# Patient Record
Sex: Male | Born: 1965 | Race: White | Hispanic: No | Marital: Married | Smoking: Never smoker
Health system: Southern US, Community
[De-identification: ages and names within clinical notes are randomized; demographics above are authoritative.]

## PROBLEM LIST (undated history)

## (undated) DIAGNOSIS — E669 Obesity, unspecified: Secondary | ICD-10-CM

## (undated) DIAGNOSIS — L409 Psoriasis, unspecified: Secondary | ICD-10-CM

## (undated) DIAGNOSIS — E785 Hyperlipidemia, unspecified: Secondary | ICD-10-CM

## (undated) DIAGNOSIS — G4726 Circadian rhythm sleep disorder, shift work type: Secondary | ICD-10-CM

## (undated) DIAGNOSIS — R5383 Other fatigue: Secondary | ICD-10-CM

## (undated) DIAGNOSIS — N200 Calculus of kidney: Secondary | ICD-10-CM

## (undated) DIAGNOSIS — E66811 Obesity, class 1: Secondary | ICD-10-CM

## (undated) DIAGNOSIS — I10 Essential (primary) hypertension: Secondary | ICD-10-CM

## (undated) DIAGNOSIS — Z8042 Family history of malignant neoplasm of prostate: Secondary | ICD-10-CM

## (undated) HISTORY — DX: Essential (primary) hypertension: I10

## (undated) HISTORY — DX: Obesity, unspecified: E66.9

## (undated) HISTORY — DX: Hyperlipidemia, unspecified: E78.5

## (undated) HISTORY — DX: Other fatigue: R53.83

## (undated) HISTORY — DX: Obesity, class 1: E66.811

## (undated) HISTORY — DX: Circadian rhythm sleep disorder, shift work type: G47.26

## (undated) HISTORY — DX: Calculus of kidney: N20.0

## (undated) HISTORY — PX: KIDNEY STONE SURGERY: SHX686

## (undated) HISTORY — DX: Family history of malignant neoplasm of prostate: Z80.42

## (undated) HISTORY — PX: KNEE ARTHROSCOPY W/ ACL RECONSTRUCTION AND PATELLA GRAFT: SHX1861

## (undated) HISTORY — DX: Psoriasis, unspecified: L40.9

---

## 2008-03-27 ENCOUNTER — Ambulatory Visit: Payer: Self-pay | Admitting: Occupational Medicine

## 2008-09-24 ENCOUNTER — Ambulatory Visit: Payer: Self-pay | Admitting: Family Medicine

## 2011-05-08 ENCOUNTER — Encounter: Payer: Self-pay | Admitting: Family Medicine

## 2011-05-08 ENCOUNTER — Ambulatory Visit (INDEPENDENT_AMBULATORY_CARE_PROVIDER_SITE_OTHER): Payer: BC Managed Care – PPO | Admitting: Family Medicine

## 2011-05-08 DIAGNOSIS — Z136 Encounter for screening for cardiovascular disorders: Secondary | ICD-10-CM

## 2011-05-08 DIAGNOSIS — E669 Obesity, unspecified: Secondary | ICD-10-CM

## 2011-05-08 DIAGNOSIS — Z1211 Encounter for screening for malignant neoplasm of colon: Secondary | ICD-10-CM

## 2011-05-08 DIAGNOSIS — R5383 Other fatigue: Secondary | ICD-10-CM | POA: Insufficient documentation

## 2011-05-08 DIAGNOSIS — N200 Calculus of kidney: Secondary | ICD-10-CM

## 2011-05-08 DIAGNOSIS — Z23 Encounter for immunization: Secondary | ICD-10-CM

## 2011-05-08 DIAGNOSIS — Z0000 Encounter for general adult medical examination without abnormal findings: Secondary | ICD-10-CM

## 2011-05-08 DIAGNOSIS — Z283 Underimmunization status: Secondary | ICD-10-CM

## 2011-05-08 HISTORY — DX: Calculus of kidney: N20.0

## 2011-05-08 LAB — CBC WITH DIFFERENTIAL/PLATELET
Eosinophils Absolute: 0.2 10*3/uL (ref 0.0–0.7)
Hemoglobin: 15.4 g/dL (ref 13.0–17.0)
Lymphocytes Relative: 17 % (ref 12–46)
Lymphs Abs: 1.8 10*3/uL (ref 0.7–4.0)
MCH: 30.8 pg (ref 26.0–34.0)
Monocytes Relative: 9 % (ref 3–12)
Neutro Abs: 7.6 10*3/uL (ref 1.7–7.7)
Neutrophils Relative %: 73 % (ref 43–77)
RBC: 5 MIL/uL (ref 4.22–5.81)
WBC: 10.4 10*3/uL (ref 4.0–10.5)

## 2011-05-08 LAB — COMPREHENSIVE METABOLIC PANEL
ALT: 54 U/L — ABNORMAL HIGH (ref 0–53)
Albumin: 4.5 g/dL (ref 3.5–5.2)
CO2: 28 mEq/L (ref 19–32)
Chloride: 102 mEq/L (ref 96–112)
Glucose, Bld: 89 mg/dL (ref 70–99)
Potassium: 4 mEq/L (ref 3.5–5.3)
Sodium: 139 mEq/L (ref 135–145)
Total Protein: 7.2 g/dL (ref 6.0–8.3)

## 2011-05-08 LAB — POCT URINALYSIS DIPSTICK
Bilirubin, UA: NEGATIVE
Glucose, UA: NEGATIVE
Leukocytes, UA: NEGATIVE
Nitrite, UA: NEGATIVE
Urobilinogen, UA: 0.2

## 2011-05-08 LAB — HEMOGLOBIN A1C: Hgb A1c MFr Bld: 5.9 % — ABNORMAL HIGH (ref <5.7)

## 2011-05-08 LAB — PSA: PSA: 1.12 ng/mL (ref <=4.00)

## 2011-05-08 LAB — LIPID PANEL
Cholesterol: 185 mg/dL (ref 0–200)
Triglycerides: 239 mg/dL — ABNORMAL HIGH (ref <150)

## 2011-05-08 LAB — VITAMIN B12: Vitamin B-12: 462 pg/mL (ref 211–911)

## 2011-05-08 MED ORDER — OMEPRAZOLE 40 MG PO CPDR: 40.0000 mg | DELAYED_RELEASE_CAPSULE | Freq: Every day | ORAL | Status: DC

## 2011-05-08 NOTE — Patient Instructions (Signed)
Exercise Exercise to Lose Weight Exercise and a healthy diet may help you lose weight. Your doctor may suggest specific exercises. EXERCISE IDEAS AND TIPS  Choose low-cost things you enjoy doing, such as walking, bicycling, or exercising to workout videos.   Take stairs instead of the elevator.   Walk during your lunch break.   Park your car further away from work or school.   Go to a gym or an exercise class.   Start with 5 to 10 minutes of exercise each day. Build up to 30 minutes of exercise 4 to 6 days a week.   Wear shoes with good support and comfortable clothes.   Stretch before and after working out.   Work out until you breathe harder and your heart beats faster.   Drink extra water when you exercise.   Do not do so much that you hurt yourself, feel dizzy, or get very short of breath.  Exercises that burn about 150 calories:  Running 1  miles in 15 minutes.   Playing volleyball for 45 to 60 minutes.   Washing and waxing a car for 45 to 60 minutes.   Playing touch football for 45 minutes.   Walking 1  miles in 35 minutes.   Pushing a stroller 1  miles in 30 minutes.   Playing basketball for 30 minutes.   Raking leaves for 30 minutes.   Bicycling 5 miles in 30 minutes.   Walking 2 miles in 30 minutes.   Dancing for 30 minutes.   Shoveling snow for 15 minutes.   Swimming laps for 20 minutes.   Walking up stairs for 15 minutes.   Bicycling 4 miles in 15 minutes.   Gardening for 30 to 45 minutes.   Jumping rope for 15 minutes.   Washing windows or floors for 45 to 60 minutes.  Document Released: 03/22/2010 Document Revised: 02/06/2011 Document Reviewed: 03/22/2010 [facility] Patient Information 2012 [place], [place].

## 2011-05-08 NOTE — Progress Notes (Addendum)
Subjective:    Patient ID: Jerry Miller, male    DOB: Feb 01, 1966, 46 y.o.   MRN: [id number]  HPI Patient's here for physical examination and establishment in this practice. Reports being overweight wife somewhat concerned about over fatigue. His father had prostate cancer in both his father and mother developed diabetes in his father marked diabetes before he passed away. He recognized the fact that he does need to exercise and we talked about him being at the almost 35% BMI. The only medication he is taking his Prilosec 40 mg currently he was evaluated at the GI specialty clinic and was recommended that he takes this on a regular basis. He should be noted that he has had kidney stones before.    Review of Systems  Gastrointestinal:       Heartburn and reflux  All other systems reviewed and are negative.    No Known Allergies History   Social History  . Marital Status: Married    Spouse Name: N/A    Number of Children: N/A  . Years of Education: N/A   Occupational History  . Not on file.   Social History Main Topics  . Smoking status: Never Smoker   . Smokeless tobacco: Never Used  . Alcohol Use: Not on file  . Drug Use: Not on file  . Sexually Active: Yes    Birth Control/ Protection: Implant   Other Topics Concern  . Not on file   Social History Narrative  . No narrative on file   Family History  Problem Relation Age of Onset  . Diabetes Mother   . Diabetes Father   . Heart failure Father   . Nephrolithiasis Sister    Past Medical History  Diagnosis Date  . Fatigue   . Obesity (BMI 30.0-34.9)    Past Surgical History  Procedure Date  . Knee arthroscopy w/ acl reconstruction and patella graft   . Kidney stone surgery        BP 129/82  Pulse 68  Ht 5\' 8"  (1.727 m)  Wt 230 lb (104.327 kg)  BMI 34.97 kg/m2  SpO2 98% Objective:   Physical Exam  Constitutional: He is oriented to person, place, and time. He appears well-developed and  well-nourished.       obese WM  HENT:  Head: Normocephalic and atraumatic.  Right Ear: External ear normal.  Left Ear: External ear normal.  Mouth/Throat: Oropharynx is clear and moist.  Eyes: Pupils are equal, round, and reactive to light. Right eye exhibits no discharge. Left eye exhibits no discharge.  Neck: Normal range of motion. Neck supple. No thyromegaly present.  Cardiovascular: Normal rate, regular rhythm and normal heart sounds.   Pulmonary/Chest: Effort normal and breath sounds normal. No respiratory distress. He has no wheezes.  Abdominal: Soft. Bowel sounds are normal. He exhibits no distension. There is no tenderness. There is no rebound. Hernia confirmed negative in the right inguinal area and confirmed negative in the left inguinal area.  Genitourinary: Rectum normal, prostate normal, testes normal and penis normal. Rectal exam shows no fissure, no mass, no tenderness and anal tone normal. Guaiac negative stool. Prostate is not tender. Right testis shows no mass, no swelling and no tenderness. Left testis shows no mass, no swelling and no tenderness. Circumcised. No phimosis, penile erythema or penile tenderness. No discharge found.  Musculoskeletal: Normal range of motion. He exhibits no edema and no tenderness.  Neurological: He is alert and oriented to person, place, and time. He has  normal reflexes.  Skin: Skin is warm.  Psychiatric: He has a normal mood and affect. His behavior is normal.    Results for orders placed in visit on 05/08/11  POCT URINALYSIS DIPSTICK      Component Value Range   Color, UA yellow     Clarity, UA clear     Glucose, UA neg     Bilirubin, UA neg     Ketones, UA neg     Spec Grav, UA >=1.030     Blood, UA trace-intact     pH, UA 6.0     Protein, UA neg     Urobilinogen, UA 0.2     Nitrite, UA neg     Leukocytes, UA Negative     EKG normal sinus rhythm.     Assessment & Plan:  #1 health maintenance. Stressed need to exercise to lose  weight obtain PSA TSH B12 lipid CMP CBC #2 obesity stressed to him to start exercising discuss some other weight loss programs he may want to look at #3 he has had a history of hyperlipidemia before in the past was never placed on medication so we will have him come back in 6 months followup #4 immunization update will be done as well.

## 2011-05-09 ENCOUNTER — Encounter: Payer: Self-pay | Admitting: Family Medicine

## 2011-05-13 ENCOUNTER — Encounter: Payer: Self-pay | Admitting: *Deleted

## 2011-11-04 ENCOUNTER — Ambulatory Visit (INDEPENDENT_AMBULATORY_CARE_PROVIDER_SITE_OTHER): Payer: BC Managed Care – PPO | Admitting: Family Medicine

## 2011-11-04 ENCOUNTER — Encounter: Payer: Self-pay | Admitting: Family Medicine

## 2011-11-04 VITALS — BP 129/88 | HR 68 | Ht 68.0 in | Wt 227.0 lb

## 2011-11-04 DIAGNOSIS — R030 Elevated blood-pressure reading, without diagnosis of hypertension: Secondary | ICD-10-CM

## 2011-11-04 DIAGNOSIS — R7301 Impaired fasting glucose: Secondary | ICD-10-CM

## 2011-11-04 DIAGNOSIS — E669 Obesity, unspecified: Secondary | ICD-10-CM

## 2011-11-04 DIAGNOSIS — J3489 Other specified disorders of nose and nasal sinuses: Secondary | ICD-10-CM

## 2011-11-04 DIAGNOSIS — M5380 Other specified dorsopathies, site unspecified: Secondary | ICD-10-CM

## 2011-11-04 DIAGNOSIS — E785 Hyperlipidemia, unspecified: Secondary | ICD-10-CM

## 2011-11-04 DIAGNOSIS — M62830 Muscle spasm of back: Secondary | ICD-10-CM

## 2011-11-04 LAB — LIPID PANEL
HDL: 41 mg/dL (ref >39)
Total CHOL/HDL Ratio: 4.2 Ratio
Triglycerides: 178 mg/dL — ABNORMAL HIGH (ref <150)

## 2011-11-04 MED ORDER — FEXOFENADINE-PSEUDOEPHED ER 180-240 MG PO TB24: 1.0000 | ORAL_TABLET | Freq: Every day | ORAL | Status: DC

## 2011-11-04 MED ORDER — ORPHENADRINE CITRATE ER 100 MG PO TB12: 100.0000 mg | ORAL_TABLET | Freq: Every evening | ORAL | Status: DC | PRN

## 2011-11-04 MED ORDER — MOMETASONE FUROATE 50 MCG/ACT NA SUSP: 2.0000 | Freq: Every day | NASAL | Status: DC

## 2011-11-04 MED ORDER — MELOXICAM 15 MG PO TABS: 15.0000 mg | ORAL_TABLET | Freq: Every day | ORAL | Status: DC | PRN

## 2011-11-04 NOTE — Progress Notes (Signed)
Subjective:    Patient ID: Jerry Miller, male    DOB: 04/03/1965, 46 y.o.   MRN: [id number]  HPI #1 obesity/hyperlipidemia patient has been taking some niacin tablets not every day but some to see if that would help with his cholesterol triglyceride of. His triglycerides were elevated at 239. He admits that he has not been exercising a regular basis and that is something he needs to do #2 elevated blood sugar/early metabolic syndrome A1c was elevated at 5.9 at his physical we will need to recheck that. Also explained to him the importance of A1c and that prolonged A1c in my own clinical experiences only gets worse and not better unless something active is done by the patient. #3 borderline hypertension/elevated blood pressure this could be part of the metabolic syndrome. #4 sinus drainage problems. He finds that when he has a URI his sinuses become quickly plugged and he will start developing sinus headache and pain until the blockage improves.Marland Kitchen He wants to stay away from antibiotics which is good wants note was that would help improve his sinus drainage when he experiences a URI. 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 He states that there are palpable lungs in his back when this happens but those lumps are not present now.   #6 he has also noticed some lumps on his right forearm he wants me to examine as well.    Review of Systems  Constitutional: Positive for appetite change. Negative for activity change.  Neurological: Negative for dizziness.  All other systems reviewed and are negative.     BP 129/88  Pulse 68  Ht 5\' 8"  (1.727 m)  Wt 227 lb (102.967 kg)  BMI 34.52 kg/m2  SpO2 98% Objective:   Physical Exam  Vitals reviewed. Constitutional: He is oriented to person, place, and time. He appears well-developed and well-nourished.  HENT:  Head: Normocephalic.  Neck: Normal range of motion. Neck supple.  Cardiovascular:  Normal rate and regular rhythm.   Pulmonary/Chest: Effort normal and breath sounds normal.  Musculoskeletal: Normal range of motion. He exhibits no edema and no tenderness.       No muscle spasms of the back were found at this time. Patient had no tenderness and good range of motion.  Along the right forearm small lipoma or sebaceous cyst could be palpated not tender to palpation or to touch.    Neurological: He is alert and oriented to person, place, and time.  Skin: Skin is warm and dry. No erythema.  Psychiatric: He has a normal mood and affect. His behavior is normal.      Results for orders placed in visit on 05/08/11  HEMOGLOBIN A1C      Component Value Range   Hemoglobin A1C 5.9 (*) <5.7 %   Mean Plasma Glucose 123 (*) <117 mg/dL  CBC WITH DIFFERENTIAL      Component Value Range   WBC 10.4  4.0 - 10.5 K/uL   RBC 5.00  4.22 - 5.81 MIL/uL   Hemoglobin 15.4  13.0 - 17.0 g/dL   HCT 69.6  29.5 - 28.4 %   MCV 91.0  78.0 - 100.0 fL   MCH 30.8  26.0 - 34.0 pg   MCHC 33.8  30.0 - 36.0 g/dL   RDW 13.2  44.0 - 10.2 %   Platelets 250  150 - 400 K/uL   Neutrophils Relative 73  43 - 77 %  Neutro Abs 7.6  1.7 - 7.7 K/uL   Lymphocytes Relative 17  12 - 46 %   Lymphs Abs 1.8  0.7 - 4.0 K/uL   Monocytes Relative 9  3 - 12 %   Monocytes Absolute 0.9  0.1 - 1.0 K/uL   Eosinophils Relative 1  0 - 5 %   Eosinophils Absolute 0.2  0.0 - 0.7 K/uL   Basophils Relative 0  0 - 1 %   Basophils Absolute 0.0  0.0 - 0.1 K/uL   Smear Review Criteria for review not met    COMPREHENSIVE METABOLIC PANEL      Component Value Range   Sodium 139  135 - 145 mEq/L   Potassium 4.0  3.5 - 5.3 mEq/L   Chloride 102  96 - 112 mEq/L   CO2 28  19 - 32 mEq/L   Glucose, Bld 89  70 - 99 mg/dL   BUN 13  6 - 23 mg/dL   Creat 1.19  1.47 - 8.29 mg/dL   Total Bilirubin 0.7  0.3 - 1.2 mg/dL   Alkaline Phosphatase 84  39 - 117 U/L   AST 24  0 - 37 U/L   ALT 54 (*) 0 - 53 U/L   Total Protein 7.2  6.0 - 8.3 g/dL    Albumin 4.5  3.5 - 5.2 g/dL   Calcium 9.6  8.4 - 56.2 mg/dL  LIPID PANEL      Component Value Range   Cholesterol 185  0 - 200 mg/dL   Triglycerides 130 (*) <150 mg/dL   HDL 40  >86 mg/dL   Total CHOL/HDL Ratio 4.6     VLDL 48 (*) 0 - 40 mg/dL   LDL Cholesterol 97  0 - 99 mg/dL  TSH      Component Value Range   TSH 2.391  0.350 - 4.500 uIU/mL  VITAMIN B12      Component Value Range   Vitamin B-12 462  211 - 911 pg/mL  PSA      Component Value Range   PSA 1.12  <=4.00 ng/mL  POCT URINALYSIS DIPSTICK      Component Value Range   Color, UA yellow     Clarity, UA clear     Glucose, UA neg     Bilirubin, UA neg     Ketones, UA neg     Spec Grav, UA >=1.030     Blood, UA trace-intact     pH, UA 6.0     Protein, UA neg     Urobilinogen, UA 0.2     Nitrite, UA neg     Leukocytes, UA Negative          Lab Results  Component Value Date   HGBA1C 5.9* 05/08/2011   Results for orders placed in visit on 11/04/11  POCT GLYCOSYLATED HEMOGLOBIN (HGB A1C)      Component Value Range   Hemoglobin A1C 5.9      Assessment & Plan:  #1 obesity/hyperlipidemia stressed the importance of exercise discussed the third middle replacements we can try even supplements or medications to help reduce his appetite but the most important thing is  getting involved in an exercise program. We will see him back in 7 months for his yearly exam. We will check a lipid panel today.  #2 syndrome x.  At this time with his blood pressure being borderline and his A1c staying the same no new medications were started and will followup in 7 months.  #  3 sinus pressure.will prescribe Flonase and Allegra-D for patient to use when necessary basis when he he feels his sinus ducts are obstructed.   #4 back pain/muscle spasm. Explained that this appears to be muscle spasms and that along with ice on the back we will try him on some Mobic 15 mg a day and Norflex 100 mg at night when he does have muscle spasms.   #5  lipomas/sebaceous cyst of the right forearm. Explained to him since his night growing would not do anything different her to come back if they start growing or becomes painful.

## 2011-11-04 NOTE — Patient Instructions (Signed)
Hypertriglyceridemia  Diet for High blood levels of Triglycerides Most fats in food are triglycerides. Triglycerides in your blood are stored as fat in your body. High levels of triglycerides in your blood may put you at a greater risk for heart disease and stroke.  Normal triglyceride levels are less than 150 mg/dL. Borderline high levels are 150-199 mg/dl. High levels are 200 - 499 mg/dL, and very high triglyceride levels are greater than 500 mg/dL. The decision to treat high triglycerides is generally based on the level. For people with borderline or high triglyceride levels, treatment includes weight loss and exercise. Drugs are recommended for people with very high triglyceride levels. Many people who need treatment for high triglyceride levels have metabolic syndrome. This syndrome is a collection of disorders that often include: insulin resistance, high blood pressure, blood clotting problems, high cholesterol and triglycerides. TESTING PROCEDURE FOR TRIGLYCERIDES  You should not eat 4 hours before getting your triglycerides measured. The normal range of triglycerides is between 10 and 250 milligrams per deciliter (mg/dl). Some people may have extreme levels (1000 or above), but your triglyceride level may be too high if it is above 150 mg/dl, depending on what other risk factors you have for heart disease.   People with high blood triglycerides may also have high blood cholesterol levels. If you have high blood cholesterol as well as high blood triglycerides, your risk for heart disease is probably greater than if you only had high triglycerides. High blood cholesterol is one of the main risk factors for heart disease.  CHANGING YOUR DIET  Your weight can affect your blood triglyceride level. If you are more than 20% above your ideal body weight, you may be able to lower your blood triglycerides by losing weight. Eating less and exercising regularly is the best way to combat this. Fat provides  more calories than any other food. The best way to lose weight is to eat less fat. Only 30% of your total calories should come from fat. Less than 7% of your diet should come from saturated fat. A diet low in fat and saturated fat is the same as a diet to decrease blood cholesterol. By eating a diet lower in fat, you may lose weight, lower your blood cholesterol, and lower your blood triglyceride level.  Eating a diet low in fat, especially saturated fat, may also help you lower your blood triglyceride level. Ask your dietitian to help you figure how much fat you can eat based on the number of calories your caregiver has prescribed for you.  Exercise, in addition to helping with weight loss may also help lower triglyceride levels.   Alcohol can increase blood triglycerides. You may need to stop drinking alcoholic beverages.   Too much carbohydrate in your diet may also increase your blood triglycerides. Some complex carbohydrates are necessary in your diet. These may include bread, rice, potatoes, other starchy vegetables and cereals.   Reduce "simple" carbohydrates. These may include pure sugars, candy, honey, and jelly without losing other nutrients. If you have the kind of high blood triglycerides that is affected by the amount of carbohydrates in your diet, you will need to eat less sugar and less high-sugar foods. Your caregiver can help you with this.   Adding 2-4 grams of fish oil (EPA+ DHA) may also help lower triglycerides. Speak with your caregiver before adding any supplements to your regimen.  Following the Diet  Maintain your ideal weight. Your caregivers can help you with a diet. Generally,   eating less food and getting more exercise will help you lose weight. Joining a weight control group may also help. Ask your caregivers for a good weight control group in your area.  Eat low-fat foods instead of high-fat foods. This can help you lose weight too.  These foods are lower in fat. Eat MORE  of these:   Dried beans, peas, and lentils.   Egg whites.   Low-fat cottage cheese.   Fish.   Lean cuts of meat, such as round, sirloin, rump, and flank (cut extra fat off meat you fix).   Whole grain breads, cereals and pasta.   Skim and nonfat dry milk.   Low-fat yogurt.   Poultry without the skin.   Cheese made with skim or part-skim milk, such as mozzarella, parmesan, farmers', ricotta, or pot cheese.  These are higher fat foods. Eat LESS of these:   Whole milk and foods made from whole milk, such as American, blue, cheddar, monterey jack, and swiss cheese   High-fat meats, such as luncheon meats, sausages, knockwurst, bratwurst, hot dogs, ribs, corned beef, ground pork, and regular ground beef.   Fried foods.  Limit saturated fats in your diet. Substituting unsaturated fat for saturated fat may decrease your blood triglyceride level. You will need to read package labels to know which products contain saturated fats.  These foods are high in saturated fat. Eat LESS of these:   Fried pork skins.   Whole milk.   Skin and fat from poultry.   Palm oil.   Butter.   Shortening.   Cream cheese.   Bacon.   Margarines and baked goods made from listed oils.   Vegetable shortenings.   Chitterlings.   Fat from meats.   Coconut oil.   Palm kernel oil.   Lard.   Cream.   Sour cream.   Fatback.   Coffee whiteners and non-dairy creamers made with these oils.   Cheese made from whole milk.  Use unsaturated fats (both polyunsaturated and monounsaturated) moderately. Remember, even though unsaturated fats are better than saturated fats; you still want a diet low in total fat.  These foods are high in unsaturated fat:   Canola oil.   Sunflower oil.   Mayonnaise.   Almonds.   Peanuts.   Pine nuts.   Margarines made with these oils.   Safflower oil.   Olive oil.   Avocados.   Cashews.   Peanut butter.   Sunflower seeds.   Soybean oil.     Peanut oil.   Olives.   Pecans.   Walnuts.   Pumpkin seeds.  Avoid sugar and other high-sugar foods. This will decrease carbohydrates without decreasing other nutrients. Sugar in your food goes rapidly to your blood. When there is excess sugar in your blood, your liver may use it to make more triglycerides. Sugar also contains calories without other important nutrients.  Eat LESS of these:   Sugar, brown sugar, powdered sugar, jam, jelly, preserves, honey, syrup, molasses, pies, candy, cakes, cookies, frosting, pastries, colas, soft drinks, punches, fruit drinks, and regular gelatin.   Avoid alcohol. Alcohol, even more than sugar, may increase blood triglycerides. In addition, alcohol is high in calories and low in nutrients. Ask for sparkling water, or a diet soft drink instead of an alcoholic beverage.  Suggestions for planning and preparing meals   Bake, broil, grill or roast meats instead of frying.   Remove fat from meats and skin from poultry before cooking.   Add spices,   herbs, lemon juice or vinegar to vegetables instead of salt, rich sauces or gravies.   Use a non-stick skillet without fat or use no-stick sprays.   Cool and refrigerate stews and broth. Then remove the hardened fat floating on the surface before serving.   Refrigerate meat drippings and skim off fat to make low-fat gravies.   Serve more fish.   Use less butter, margarine and other high-fat spreads on bread or vegetables.   Use skim or reconstituted non-fat dry milk for cooking.   Cook with low-fat cheeses.   Substitute low-fat yogurt or cottage cheese for all or part of the sour cream in recipes for sauces, dips or congealed salads.   Use half yogurt/half mayonnaise in salad recipes.   Substitute evaporated skim milk for cream. Evaporated skim milk or reconstituted non-fat dry milk can be whipped and substituted for whipped cream in certain recipes.   Choose fresh fruits for dessert instead of  high-fat foods such as pies or cakes. Fruits are naturally low in fat.  When Dining Out   Order low-fat appetizers such as fruit or vegetable juice, pasta with vegetables or tomato sauce.   Select clear, rather than cream soups.   Ask that dressings and gravies be served on the side. Then use less of them.   Order foods that are baked, broiled, poached, steamed, stir-fried, or roasted.   Ask for margarine instead of butter, and use only a small amount.   Drink sparkling water, unsweetened tea or coffee, or diet soft drinks instead of alcohol or other sweet beverages.  QUESTIONS AND ANSWERS ABOUT OTHER FATS IN THE BLOOD: SATURATED FAT, TRANS FAT, AND CHOLESTEROL What is trans fat? Trans fat is a type of fat that is formed when vegetable oil is hardened through a process called hydrogenation. This process helps makes foods more solid, gives them shape, and prolongs their shelf life. Trans fats are also called hydrogenated or partially hydrogenated oils.  What do saturated fat, trans fat, and cholesterol in foods have to do with heart disease? Saturated fat, trans fat, and cholesterol in the diet all raise the level of LDL "bad" cholesterol in the blood. The higher the LDL cholesterol, the greater the risk for coronary heart disease (CHD). Saturated fat and trans fat raise LDL similarly.  What foods contain saturated fat, trans fat, and cholesterol? High amounts of saturated fat are found in animal products, such as fatty cuts of meat, chicken skin, and full-fat dairy products like butter, whole milk, cream, and cheese, and in tropical vegetable oils such as palm, palm kernel, and coconut oil. Trans fat is found in some of the same foods as saturated fat, such as vegetable shortening, some margarines (especially hard or stick margarine), crackers, cookies, baked goods, fried foods, salad dressings, and other processed foods made with partially hydrogenated vegetable oils. Small amounts of trans fat  also occur naturally in some animal products, such as milk products, beef, and lamb. Foods high in cholesterol include liver, other organ meats, egg yolks, shrimp, and full-fat dairy products. How can I use the new food label to make heart-healthy food choices? Check the Nutrition Facts panel of the food label. Choose foods lower in saturated fat, trans fat, and cholesterol. For saturated fat and cholesterol, you can also use the Percent Daily Value (%DV): 5% DV or less is low, and 20% DV or more is high. (There is no %DV for trans fat.) Use the Nutrition Facts panel to choose foods low in   saturated fat and cholesterol, and if the trans fat is not listed, read the ingredients and limit products that list shortening or hydrogenated or partially hydrogenated vegetable oil, which tend to be high in trans fat. POINTS TO REMEMBER: YOU NEED A LITTLE TLC (THERAPEUTIC LIFESTYLE CHANGES)  Discuss your risk for heart disease with your caregivers, and take steps to reduce risk factors.   Change your diet. Choose foods that are low in saturated fat, trans fat, and cholesterol.   Add exercise to your daily routine if it is not already being done. Participate in physical activity of moderate intensity, like brisk walking, for at least 30 minutes on most, and preferably all days of the week. No time? Break the 30 minutes into three, 10-minute segments during the day.   Stop smoking. If you do smoke, contact your caregiver to discuss ways in which they can help you quit.   Do not use street drugs.   Maintain a normal weight.   Maintain a healthy blood pressure.   Keep up with your blood work for checking the fats in your blood as directed by your caregiver.  Document Released: 12/06/2003 Document Revised: 02/06/2011 Document Reviewed: 07/03/2008 [facility] Patient Information 2012 [place], [place].Exercise to Lose Weight Exercise and a healthy diet may help you lose weight. Your doctor may suggest specific  exercises. EXERCISE IDEAS AND TIPS  Choose low-cost things you enjoy doing, such as walking, bicycling, or exercising to workout videos.   Take stairs instead of the elevator.   Walk during your lunch break.   Park your car further away from work or school.   Go to a gym or an exercise class.   Start with 5 to 10 minutes of exercise each day. Build up to 30 minutes of exercise 4 to 6 days a week.   Wear shoes with good support and comfortable clothes.   Stretch before and after working out.   Work out until you breathe harder and your heart beats faster.   Drink extra water when you exercise.   Do not do so much that you hurt yourself, feel dizzy, or get very short of breath.  Exercises that burn about 150 calories:  Running 1  miles in 15 minutes.   Playing volleyball for 45 to 60 minutes.   Washing and waxing a car for 45 to 60 minutes.   Playing touch football for 45 minutes.   Walking 1  miles in 35 minutes.   Pushing a stroller 1  miles in 30 minutes.   Playing basketball for 30 minutes.   Raking leaves for 30 minutes.   Bicycling 5 miles in 30 minutes.   Walking 2 miles in 30 minutes.   Dancing for 30 minutes.   Shoveling snow for 15 minutes.   Swimming laps for 20 minutes.   Walking up stairs for 15 minutes.   Bicycling 4 miles in 15 minutes.   Gardening for 30 to 45 minutes.   Jumping rope for 15 minutes.   Washing windows or floors for 45 to 60 minutes.  Document Released: 03/22/2010 Document Revised: 02/06/2011 Document Reviewed: 03/22/2010 [facility] Patient Information 2012 [place], [place].

## 2012-06-03 ENCOUNTER — Encounter: Payer: BC Managed Care – PPO | Admitting: Family Medicine

## 2012-06-07 ENCOUNTER — Encounter: Payer: Self-pay | Admitting: Family Medicine

## 2012-06-07 ENCOUNTER — Ambulatory Visit (INDEPENDENT_AMBULATORY_CARE_PROVIDER_SITE_OTHER): Payer: BC Managed Care – PPO | Admitting: Family Medicine

## 2012-06-07 VITALS — BP 135/92 | HR 67 | Ht 68.0 in | Wt 234.0 lb

## 2012-06-07 DIAGNOSIS — Z8042 Family history of malignant neoplasm of prostate: Secondary | ICD-10-CM | POA: Insufficient documentation

## 2012-06-07 DIAGNOSIS — Z0000 Encounter for general adult medical examination without abnormal findings: Secondary | ICD-10-CM

## 2012-06-07 DIAGNOSIS — R030 Elevated blood-pressure reading, without diagnosis of hypertension: Secondary | ICD-10-CM

## 2012-06-07 DIAGNOSIS — E781 Pure hyperglyceridemia: Secondary | ICD-10-CM

## 2012-06-07 DIAGNOSIS — R748 Abnormal levels of other serum enzymes: Secondary | ICD-10-CM

## 2012-06-07 DIAGNOSIS — R7309 Other abnormal glucose: Secondary | ICD-10-CM

## 2012-06-07 DIAGNOSIS — R7303 Prediabetes: Secondary | ICD-10-CM

## 2012-06-07 HISTORY — DX: Family history of malignant neoplasm of prostate: Z80.42

## 2012-06-07 NOTE — Patient Instructions (Addendum)
Dr. Baldemar Miller's General Advice Following Your Complete Physical Exam  The Benefits of Regular Exercise: Unless you suffer from an uncontrolled cardiovascular condition, studies strongly suggest that regular exercise and physical activity will add to both the quality and length of your life.  The World Health Organization recommends 150 minutes of moderate intensity aerobic activity every week.  This is best split over 3-4 days a week, and can be as simple as a brisk walk for just over 35 minutes "most days of the week".  This type of exercise has been shown to lower LDL-Cholesterol, lower average blood sugars, lower blood pressure, lower cardiovascular disease risk, improve memory, and increase one's overall sense of wellbeing.  The addition of anaerobic (or "strength training") exercises offers additional benefits including but not limited to increased metabolism, prevention of osteoporosis, and improved overall cholesterol levels.  How Can I Strive For A Low-Fat Diet?: Current guidelines recommend that 25-35 percent of your daily energy (food) intake should come from fats.  One might ask how can this be achieved without having to dissect each meal on a daily basis?  Switch to skim or 1% milk instead of whole milk.  Focus on lean meats such as ground turkey, fresh fish, baked chicken, and lean cuts of beef as your source of dietary protein.  Consume less than 300mg/day of dietary cholesterol.  Limit trans fatty acid consumption primarily by limiting synthetic trans fats such as partially hydrogenated oils (Ex: fried fast foods).  Focus efforts on reducing your intake of "solid" fats (Ex: Butter).  Substitute olive or vegetable oil for solid fats where possible.  Moderation of Salt Intake: Provided you don't carry a diagnosis of congestive heart failure nor renal failure, I recommend a daily allowance of no more than 2300 mg of salt (sodium).  Keeping under this daily goal is associated with a  decreased risk of cardiovascular events, creeping above it can lead to elevated blood pressures and increases your risk of cardiovascular events.  Milligrams (mg) of salt is listed on all nutrition labels, and your daily intake can add up faster than you think.  Most canned and frozen dinners can pack in over half your daily salt allowance in one meal.    Lifestyle Health Risks: Certain lifestyle choices carry specific health risks.  As you may already know, tobacco use has been associated with increasing one's risk of cardiovascular disease, pulmonary disease, numerous cancers, among many other issues.  What you may not know is that there are medications and nicotine replacement strategies that can more than double your chances of successfully quitting.  I would be thrilled to help manage your quitting strategy if you currently use tobacco products.  When it comes to alcohol use, I've yet to find an "ideal" daily allowance.  Provided an individual does not have a medical condition that is exacerbated by alcohol consumption, general guidelines determine "safe drinking" as no more than two standard drinks for a man or no more than one standard drink for a male per day.  However, much debate still exists on whether any amount of alcohol consumption is technically "safe".  My general advice, keep alcohol consumption to a minimum for general health promotion.  If you or others believe that alcohol, tobacco, or recreational drug use is interfering with your life, I would be happy to provide confidential counseling regarding treatment options.  General "Over The Counter" Nutrition Advice: Postmenopausal women should aim for a daily calcium intake of 1200 mg, however a significant   portion of this might already be provided by diets including milk, yogurt, cheese, and other dairy products.  Vitamin D has been shown to help preserve bone density, prevent fatigue, and has even been shown to help reduce falls in the  elderly.  Ensuring a daily intake of 800 Units of Vitamin D is a good place to start to enjoy the above benefits, we can easily check your Vitamin D level to see if you'd potentially benefit from supplementation beyond 800 Units a day.  Folic Acid intake should be of particular concern to women of childbearing age.  Daily consumption of 400-800 mcg of Folic Acid is recommended to minimize the chance of spinal cord defects in a fetus should pregnancy occur.    For many adults, accidents still remain one of the most common culprits when it comes to cause of death.  Some of the simplest but most effective preventitive habits you can adopt include regular seatbelt use, proper helmet use, securing firearms, and regularly testing your smoke and carbon monoxide detectors.  Jerry Miller B. Jerry Luba DO [facility] [address], [address] [place], [place] [address] Phone: [phone]  

## 2012-06-07 NOTE — Progress Notes (Signed)
CC: Jerry Miller is a 47 y.o. male is here for Annual Exam   Subjective: HPI:  Colonoscopy: will start age 49 Prostate: Discussed screening risks/beneifts with patient on 06/07/2012. Family history of prostate cancer therefore patient agreeable to starting PSA surveillance.  Influenza Vaccine: Out of season  Pneumovax: not indicated Td/Tdap: Up-to-date  Over the past 2 weeks have you been bothered by: - Little interest or pleasure in doing things: No - Feeling down depressed or hopeless: No  No alcohol, recreational drug use, nor tobacco use. Patient is trying to get into a formal exercise plan but has been unsuccessful with motivation. Tries to watch what he eats but admits for room for improvement  Review of Systems - General ROS: negative for - chills, fever, night sweats, weight gain or weight loss Ophthalmic ROS: negative for - decreased vision Psychological ROS: negative for - anxiety or depression ENT ROS: negative for - hearing change, nasal congestion, tinnitus or allergies Hematological and Lymphatic ROS: negative for - bleeding problems, bruising or swollen lymph nodes Breast ROS: negative Respiratory ROS: no cough, shortness of breath, or wheezing Cardiovascular ROS: no chest pain or dyspnea on exertion Gastrointestinal ROS: no abdominal pain, change in bowel habits, or black or bloody stools Genito-Urinary ROS: negative for - genital discharge, genital ulcers, incontinence or abnormal bleeding from genitals. Denies polyuria, straining to urinate, sensation of incomplete voiding, nor waking more than once a night to urinate Musculoskeletal ROS: negative for - joint pain or muscle pain Neurological ROS: negative for - headaches or memory loss Dermatological ROS: negative for lumps, mole changes, rash and skin lesion changes  Past Medical History  Diagnosis Date  . Fatigue   . Obesity (BMI 30.0-34.9)      Family History  Problem Relation Age of Onset  . Diabetes  Mother   . Diabetes Father   . Heart failure Father   . Nephrolithiasis Sister      History  Substance Use Topics  . Smoking status: Never Smoker   . Smokeless tobacco: Never Used  . Alcohol Use: Not on file     Objective: Filed Vitals:   06/07/12 0924  BP: 135/92  Pulse: 67    General: No Acute Distress HEENT: Atraumatic, normocephalic, conjunctivae normal without scleral icterus.  No nasal discharge, hearing grossly intact, TMs with good landmarks bilaterally with no middle ear abnormalities, posterior pharynx clear without oral lesions. Neck: Supple, trachea midline, no cervical nor supraclavicular adenopathy. Pulmonary: Clear to auscultation bilaterally without wheezing, rhonchi, nor rales. Cardiac: Regular rate and rhythm.  No murmurs, rubs, nor gallops. No peripheral edema.  2+ peripheral pulses bilaterally. Abdomen: Bowel sounds normal.  No masses.  Non-tender without rebound.  Negative Murphy's sign. GU:  Bilateral descended non-tender testicles without palpable abnormal masses. Declines prostate exam MSK: Grossly intact, no signs of weakness.  Full strength throughout upper and lower extremities.  Full ROM in upper and lower extremities.  No midline spinal tenderness. Neuro: Gait unremarkable, CN II-XII grossly intact.  C5-C6 Reflex 2/4 Bilaterally, L4 Reflex 2/4 Bilaterally.  Cerebellar function intact. Skin: No rashes. Psych: Alert and oriented to person/place/time.  Thought process normal. No anxiety/depression.  Assessment & Plan: Jerry Miller was seen today for annual exam.  Diagnoses and associated orders for this visit:  Annual physical exam  Hypertriglyceridemia - Lipid panel  Prediabetes - COMPLETE METABOLIC PANEL WITH GFR - Hemoglobin A1c  Elevated BP - COMPLETE METABOLIC PANEL WITH GFR  Elevated liver enzymes - COMPLETE METABOLIC PANEL WITH GFR  Family history of prostate cancer - PSA  Other Orders - NIACIN PO; Take by mouth.    Due for lipid  panel for hypertriglyceridemia Given history prediabetes due for A1c Discussed elevated blood pressure, will rule out renal contribution. Discussed diet and exercise interventions to help with prediabetes and blood pressure Recheck history of elevated liver enzymes PSA for prostate cancer surveillance  Healthy lifestyle interventions including but limited to regular exercise, a healthy low fat diet, moderation of salt intake, the dangers of tobacco/alcohol/recreational drug use, nutrition supplementation, and accident avoidance were discussed with the patient and a handout was provided for future reference.  Return in about 3 months (around 09/06/2012).

## 2012-06-14 ENCOUNTER — Other Ambulatory Visit: Payer: Self-pay | Admitting: *Deleted

## 2012-06-14 LAB — LIPID PANEL
HDL: 38 mg/dL — ABNORMAL LOW (ref >39)
LDL Cholesterol: 93 mg/dL (ref 0–99)
Total CHOL/HDL Ratio: 4.6 Ratio
VLDL: 45 mg/dL — ABNORMAL HIGH (ref 0–40)

## 2012-06-14 LAB — COMPLETE METABOLIC PANEL WITH GFR
ALT: 55 U/L — ABNORMAL HIGH (ref 0–53)
AST: 25 U/L (ref 0–37)
Alkaline Phosphatase: 79 U/L (ref 39–117)
GFR, Est Non African American: >89 mL/min
Potassium: 4.2 mEq/L (ref 3.5–5.3)
Sodium: 137 mEq/L (ref 135–145)
Total Bilirubin: 0.6 mg/dL (ref 0.3–1.2)
Total Protein: 7.3 g/dL (ref 6.0–8.3)

## 2012-06-14 LAB — PSA: PSA: 0.93 ng/mL (ref <=4.00)

## 2012-06-14 MED ORDER — OMEPRAZOLE 40 MG PO CPDR: 40.0000 mg | DELAYED_RELEASE_CAPSULE | Freq: Every day | ORAL | Status: DC

## 2012-06-15 ENCOUNTER — Telehealth: Payer: Self-pay | Admitting: Family Medicine

## 2012-06-15 DIAGNOSIS — E785 Hyperlipidemia, unspecified: Secondary | ICD-10-CM | POA: Insufficient documentation

## 2012-06-15 DIAGNOSIS — E119 Type 2 diabetes mellitus without complications: Secondary | ICD-10-CM | POA: Insufficient documentation

## 2012-06-15 DIAGNOSIS — R748 Abnormal levels of other serum enzymes: Secondary | ICD-10-CM

## 2012-06-15 DIAGNOSIS — R7303 Prediabetes: Secondary | ICD-10-CM

## 2012-06-15 DIAGNOSIS — E781 Pure hyperglyceridemia: Secondary | ICD-10-CM

## 2012-06-15 HISTORY — DX: Hyperlipidemia, unspecified: E78.5

## 2012-06-15 NOTE — Telephone Encounter (Signed)
Sue Lush, Will you please let Mr. Castorena know that his A1c is now no longer in the prediabetic range and his fasting blood sugar was within normal limits as well.  His prostate marker was normal, I'd encourage him to have this checked annually. His LDL cholesterol was great, however his triglycerides remain elevated and are causing some liver inflammation.  I'd like to know what dose of niacin he's taking as this may need to be adjusted to help with triglycerides.

## 2012-06-15 NOTE — Telephone Encounter (Signed)
LMOM for pt to return call. 

## 2012-06-15 NOTE — Telephone Encounter (Signed)
Pt notified of results; he will call us back with the dose he is taking on the niacin

## 2012-12-13 ENCOUNTER — Ambulatory Visit: Payer: BC Managed Care – PPO | Admitting: Family Medicine

## 2013-04-15 ENCOUNTER — Other Ambulatory Visit: Payer: Self-pay | Admitting: *Deleted

## 2013-04-15 MED ORDER — OSELTAMIVIR PHOSPHATE 75 MG PO CAPS: 75.0000 mg | ORAL_CAPSULE | Freq: Every day | ORAL | Status: DC

## 2013-05-26 ENCOUNTER — Other Ambulatory Visit: Payer: Self-pay | Admitting: Family Medicine

## 2013-06-06 ENCOUNTER — Encounter: Payer: Self-pay | Admitting: Family Medicine

## 2013-06-06 ENCOUNTER — Ambulatory Visit (INDEPENDENT_AMBULATORY_CARE_PROVIDER_SITE_OTHER): Payer: BC Managed Care – PPO | Admitting: Family Medicine

## 2013-06-06 VITALS — BP 149/99 | HR 95 | Temp 99.2°F | Wt 227.0 lb

## 2013-06-06 DIAGNOSIS — E781 Pure hyperglyceridemia: Secondary | ICD-10-CM

## 2013-06-06 DIAGNOSIS — R7309 Other abnormal glucose: Secondary | ICD-10-CM

## 2013-06-06 DIAGNOSIS — R7303 Prediabetes: Secondary | ICD-10-CM

## 2013-06-06 DIAGNOSIS — J029 Acute pharyngitis, unspecified: Secondary | ICD-10-CM

## 2013-06-06 LAB — POCT RAPID STREP A (OFFICE): Rapid Strep A Screen: NEGATIVE

## 2013-06-06 MED ORDER — PREDNISONE 20 MG PO TABS: ORAL_TABLET | ORAL | Status: AC

## 2013-06-06 MED ORDER — NIACIN 500 MG PO TABS: 500.0000 mg | ORAL_TABLET | Freq: Every day | ORAL | Status: DC

## 2013-06-06 NOTE — Progress Notes (Signed)
CC: Jerry CasterRobert Miller is a 48 y.o. male is here for Sore Throat   Subjective: HPI:  Patient complains of a sore throat has been present for the past 5 days moderate in severity worse with swallowing but present at rest as well. Accompanied by generalized body aches, fatigue, and loss of voice. Symptoms have not been getting better or worse since onset. No interventions as of yet. Accompanied by nonproductive cough, mild. Denies shortness of breath, wheezing, fevers, chills, confusion, difficulty swallowing, nor rash. Denies nasal congestion or sneezing.  Followup hypertriglyceridemia: He is taking niacin 500 mg daily without flushing. No right or quadrant pain or epigastric discomfort.  Prediabetes: Last A1c one year ago and was normal. He has been increasing physical activity and decreasing carbohydrate intake by limiting how much bread he eats during the day. He denies polyuria polyphasia or polydipsia.    Review Of Systems Outlined In HPI  Past Medical History  Diagnosis Date  . Fatigue   . Obesity (BMI 30.0-34.9)     Past Surgical History  Procedure Laterality Date  . Knee arthroscopy w/ acl reconstruction and patella graft    . Kidney stone surgery     Family History  Problem Relation Age of Onset  . Diabetes Mother   . Diabetes Father   . Heart failure Father   . Nephrolithiasis Sister     History   Social History  . Marital Status: Married    Spouse Name: N/A    Number of Children: N/A  . Years of Education: N/A   Occupational History  . Not on file.   Social History Main Topics  . Smoking status: Never Smoker   . Smokeless tobacco: Never Used  . Alcohol Use: Not on file  . Drug Use: Not on file  . Sexual Activity: Yes    Birth Control/ Protection: Implant   Other Topics Concern  . Not on file   Social History Narrative  . No narrative on file     Objective: BP 149/99  Pulse 95  Temp(Src) 99.2 F (37.3 C) (Oral)  Wt 227 lb (102.967 kg)  General:  Alert and Oriented, No Acute Distress HEENT: Pupils equal, round, reactive to light. Conjunctivae clear.  External ears unremarkable, canals clear with intact TMs with appropriate landmarks.  Middle ear appears open without effusion. Pink inferior turbinates.  Moist mucous membranes, pharynx without inflammation nor lesions however mild erythema. Uvula is midline.  Neck supple without palpable lymphadenopathy nor abnormal masses. Lungs: Clear to auscultation bilaterally, no wheezing/ronchi/rales.  Comfortable work of breathing. Good air movement. Cardiac: Regular rate and rhythm. Normal S1/S2.  No murmurs, rubs, nor gallops.   Extremities: No peripheral edema.  Strong peripheral pulses.  Mental Status: No depression, anxiety, nor agitation. Skin: Warm and dry.  Assessment & Plan: Jerry MaduroRobert was seen today for sore throat.  Diagnoses and associated orders for this visit:  Acute pharyngitis - POCT rapid strep A - predniSONE (DELTASONE) 20 MG tablet; Two tabs at once daily for five days.  Hypertriglyceridemia  Prediabetes  Other Orders - niacin 500 MG tablet; Take 1 tablet (500 mg total) by mouth at bedtime.    Acute pharyngitis: Rapid strep negative, discussed this is most likely due to viral pharyngitis given his discomfort start moderate dose of prednisone. Hypertriglyceridemia: Uncontrolled he is overdue for repeat lipid panel he is not fasting today so I encouraged him to return at his convenience Prediabetes: Clinically controlled overdue for A1c he would prefer to wait for  A1c check until lipids are drawn as well.  Return in about 4 weeks (around 07/04/2013) for Lipid and blood sugar followup.

## 2013-06-13 ENCOUNTER — Encounter: Payer: Self-pay | Admitting: Family Medicine

## 2013-06-13 ENCOUNTER — Ambulatory Visit (INDEPENDENT_AMBULATORY_CARE_PROVIDER_SITE_OTHER): Payer: BC Managed Care – PPO

## 2013-06-13 ENCOUNTER — Ambulatory Visit (INDEPENDENT_AMBULATORY_CARE_PROVIDER_SITE_OTHER): Payer: BC Managed Care – PPO | Admitting: Family Medicine

## 2013-06-13 VITALS — BP 128/95 | HR 84 | Temp 97.6°F | Wt 223.0 lb

## 2013-06-13 DIAGNOSIS — B370 Candidal stomatitis: Secondary | ICD-10-CM

## 2013-06-13 DIAGNOSIS — J189 Pneumonia, unspecified organism: Secondary | ICD-10-CM

## 2013-06-13 MED ORDER — NYSTATIN 100000 UNIT/ML MT SUSP: 5.0000 mL | Freq: Four times a day (QID) | OROMUCOSAL | Status: DC

## 2013-06-13 NOTE — Progress Notes (Signed)
CC: Jerry CasterRobert Kapuscinski is a 48 y.o. male is here for f/u ER   Subjective: HPI:  Emergency room followup for right middle lobe pneumonia found Thursday of last week. Presented due to fever of 103.0, nonproductive cough, body aches.  Was started on Levaquin, about 36 hours later he is no longer had any fevers. He feels somewhat fatigued but denies any lingering body aches. Cough is still nonproductive but slowly improving along with taking Mucinex. He denies any chest pain, shortness of breath, wheezing, nor blood and sputum.  He's been taking Levaquin on a daily basis, 750 mg daily. He was given a little over a week of this medication.  His only complaint today is mild dysphasia and tongue discomfort with loss of taste. This is been present since about late last week. He did take prednisone that was prescribed to him at his last visit with me.  Denies fevers, chills, chest pain, shortness of breath, wheezing, back pain, confusion, lightheadedness, nausea, abdominal pain, nor difficulty swallowing   Review Of Systems Outlined In HPI  Past Medical History  Diagnosis Date  . Fatigue   . Obesity (BMI 30.0-34.9)     Past Surgical History  Procedure Laterality Date  . Knee arthroscopy w/ acl reconstruction and patella graft    . Kidney stone surgery     Family History  Problem Relation Age of Onset  . Diabetes Mother   . Diabetes Father   . Heart failure Father   . Nephrolithiasis Sister     History   Social History  . Marital Status: Married    Spouse Name: N/A    Number of Children: N/A  . Years of Education: N/A   Occupational History  . Not on file.   Social History Main Topics  . Smoking status: Never Smoker   . Smokeless tobacco: Never Used  . Alcohol Use: Not on file  . Drug Use: Not on file  . Sexual Activity: Yes    Birth Control/ Protection: Implant   Other Topics Concern  . Not on file   Social History Narrative  . No narrative on file     Objective: BP  128/95  Pulse 84  Temp(Src) 97.6 F (36.4 C) (Oral)  Wt 223 lb (101.152 kg)  SpO2 97%  General: Alert and Oriented, No Acute Distress HEENT: Pupils equal, round, reactive to light. Conjunctivae clear.  External ears unremarkable, canals clear with intact TMs with appropriate landmarks.  Middle ear appears open without effusion. Pink inferior turbinates.  Moist mucous membranes, pharynx without inflammation however mild to moderate thrush on the tongue.  Neck supple without palpable lymphadenopathy nor abnormal masses. Lungs: Clear to auscultation bilaterally, no wheezing/ronchi/rales.  Comfortable work of breathing. Good air movement. Cardiac: Regular rate and rhythm. Normal S1/S2.  No murmurs, rubs, nor gallops.   Extremities: No peripheral edema.  Strong peripheral pulses.  Mental Status: No depression, anxiety, nor agitation. Skin: Warm and dry.  Assessment & Plan: [name] was seen today for f/u er.  Diagnoses and associated orders for this visit:  CAP (community acquired pneumonia) - DG Chest 2 View; Future  Thrush - nystatin (MYCOSTATIN) 100000 UNIT/ML suspension; Take 5 mLs (500,000 Units total) by mouth 4 (four) times daily.    Community acquired pneumonia: Clinically improving agree with continuation of Levaquin on a daily basis.  Will recheck chest x-ray today to ensure no radiographic worsening Thrush: Start nystatin this is likely due to prednisone use last week.  Return if symptoms worsen  or fail to improve.

## 2013-06-20 ENCOUNTER — Ambulatory Visit (INDEPENDENT_AMBULATORY_CARE_PROVIDER_SITE_OTHER): Payer: BC Managed Care – PPO | Admitting: Family Medicine

## 2013-06-20 ENCOUNTER — Encounter: Payer: Self-pay | Admitting: Family Medicine

## 2013-06-20 VITALS — BP 140/94 | HR 84 | Temp 98.5°F | Wt 228.0 lb

## 2013-06-20 DIAGNOSIS — J189 Pneumonia, unspecified organism: Secondary | ICD-10-CM

## 2013-06-20 DIAGNOSIS — B370 Candidal stomatitis: Secondary | ICD-10-CM

## 2013-06-20 MED ORDER — FLUCONAZOLE 100 MG PO TABS: ORAL_TABLET | ORAL | Status: DC

## 2013-06-20 NOTE — Progress Notes (Signed)
CC: Jerry CasterRobert Miller is a 48 y.o. male is here for f/u pneumonia   Subjective: HPI:  Followup community acquired pneumonia: He has taken a full week of Levaquin and now states that he is without any chest pain, shortness of breath, cough, nor fatigue. He states that he is back to 100% of his prior state of health other than a sore throat. He is asking if he can go back to work now. Denies fevers nor chills since I saw him last.  Complains of lingering throat pain is slightly improved on nystatin he stopped this medication 2 days ago after stopping his Levaquin. Pain is mild and nonradiating. Present only when swallowing. Denies difficulty swallowing, choking, nor swelling of the mouth or throat.   Review Of Systems Outlined In HPI  Past Medical History  Diagnosis Date  . Fatigue   . Obesity (BMI 30.0-34.9)     Past Surgical History  Procedure Laterality Date  . Knee arthroscopy w/ acl reconstruction and patella graft    . Kidney stone surgery     Family History  Problem Relation Age of Onset  . Diabetes Mother   . Diabetes Father   . Heart failure Father   . Nephrolithiasis Sister     History   Social History  . Marital Status: Married    Spouse Name: N/A    Number of Children: N/A  . Years of Education: N/A   Occupational History  . Not on file.   Social History Main Topics  . Smoking status: Never Smoker   . Smokeless tobacco: Never Used  . Alcohol Use: Not on file  . Drug Use: Not on file  . Sexual Activity: Yes    Birth Control/ Protection: Implant   Other Topics Concern  . Not on file   Social History Narrative  . No narrative on file     Objective: BP 140/94  Pulse 84  Temp(Src) 98.5 F (36.9 C) (Oral)  Wt 228 lb (103.42 kg)  SpO2 96%  General: Alert and Oriented, No Acute Distress HEENT: Pupils equal, round, reactive to light. Conjunctivae clear.  External ears unremarkable, canals clear with intact TMs with appropriate landmarks.  Middle ear  appears open without effusion. Pink inferior turbinates.  Moist mucous membranes, pharynx without inflammation but mild thrush on the tongue.  Neck supple without palpable lymphadenopathy nor abnormal masses. Lungs: Clear to auscultation bilaterally, no wheezing/ronchi/rales.  Comfortable work of breathing. Good air movement. Cardiac: Regular rate and rhythm. Normal S1/S2.  No murmurs, rubs, nor gallops.   Mental Status: No depression, anxiety, nor agitation. Skin: Warm and dry.  Assessment & Plan: Jerry Miller was seen today for f/u pneumonia.  Diagnoses and associated orders for this visit:  CAP (community acquired pneumonia)  Thrush - fluconazole (DIFLUCAN) 100 MG tablet; Two tabs by mouth on the first day then only one daily thereafter.    Thrush: Stop nystatin switch to fluconazole Community acquired pneumonia: Resolved I have cleared him to return to work without any restrictions.   Return if symptoms worsen or fail to improve.

## 2013-07-18 ENCOUNTER — Other Ambulatory Visit: Payer: Self-pay | Admitting: Family Medicine

## 2013-09-26 ENCOUNTER — Other Ambulatory Visit: Payer: Self-pay | Admitting: Family Medicine

## 2013-12-15 ENCOUNTER — Other Ambulatory Visit: Payer: Self-pay | Admitting: Family Medicine

## 2014-01-14 ENCOUNTER — Other Ambulatory Visit: Payer: Self-pay | Admitting: Family Medicine

## 2014-05-15 ENCOUNTER — Encounter: Payer: Self-pay | Admitting: Family Medicine

## 2014-05-15 ENCOUNTER — Ambulatory Visit (INDEPENDENT_AMBULATORY_CARE_PROVIDER_SITE_OTHER): Payer: BLUE CROSS/BLUE SHIELD | Admitting: Family Medicine

## 2014-05-15 VITALS — BP 140/97 | HR 81 | Temp 98.1°F | Wt 229.0 lb

## 2014-05-15 DIAGNOSIS — H6982 Other specified disorders of Eustachian tube, left ear: Secondary | ICD-10-CM | POA: Diagnosis not present

## 2014-05-15 MED ORDER — PREDNISONE 20 MG PO TABS: ORAL_TABLET | ORAL | Status: AC

## 2014-05-15 NOTE — Progress Notes (Signed)
CC: Jerry CasterRobert Miller is a 49 y.o. male is here for left ear pain   Subjective: HPI:  Complains of left-sided ear pain described as sharp and full at the same time. Began last night he's never had this before. His mild in severity. Accompanied by nasal congestion and postnasal drip last week but this resolved. No interventions as of yet. Nothing seems to make it better or worse. He had some muffled sounds in his left ear last week but this also resolved. He denies any drainage from the ear, fevers, chills, swollen lymph nodes. No sore throat nor any motor or sensory disturbances other than that described above.   Review Of Systems Outlined In HPI  Past Medical History  Diagnosis Date  . Fatigue   . Obesity (BMI 30.0-34.9)     Past Surgical History  Procedure Laterality Date  . Knee arthroscopy w/ acl reconstruction and patella graft    . Kidney stone surgery     Family History  Problem Relation Age of Onset  . Diabetes Mother   . Diabetes Father   . Heart failure Father   . Nephrolithiasis Sister     History   Social History  . Marital Status: Married    Spouse Name: N/A  . Number of Children: N/A  . Years of Education: N/A   Occupational History  . Not on file.   Social History Main Topics  . Smoking status: Never Smoker   . Smokeless tobacco: Never Used  . Alcohol Use: Not on file  . Drug Use: Not on file  . Sexual Activity: Yes    Birth Control/ Protection: Implant   Other Topics Concern  . Not on file   Social History Narrative     Objective: BP 140/97 mmHg  Pulse 81  Temp(Src) 98.1 F (36.7 C) (Oral)  Wt 229 lb (103.874 kg)  General: Alert and Oriented, No Acute Distress HEENT: Pupils equal, round, reactive to light. Conjunctivae clear.  External ears unremarkable, canals clear with intact TMs with appropriate landmarks.  Right Middle ear appears open without effusion, left middle ear has a mild serous effusion. Pink inferior turbinates.  Moist mucous  membranes, pharynx without inflammation nor lesions.  Neck supple without palpable lymphadenopathy nor abnormal masses. Lungs: Clear to auscultation bilaterally, no wheezing/ronchi/rales.  Comfortable work of breathing. Good air movement. Extremities: No peripheral edema.  Strong peripheral pulses.  Mental Status: No depression, anxiety, nor agitation. Skin: Warm and dry.  Assessment & Plan: Jerry MaduroRobert was seen today for left ear pain.  Diagnoses and all orders for this visit:  Eustachian tube dysfunction, left Orders: -     predniSONE (DELTASONE) 20 MG tablet; Three tabs at once daily for five days.   Eustachian tube dysfunction: Start prednisone, consider nasal saline washes.  I've asked him to come back for a complete physical exam within the next 4 weeks and also to recheck blood pressure  Return in about 4 weeks (around 06/12/2014) for Complete Physical.

## 2014-06-15 ENCOUNTER — Encounter: Payer: Self-pay | Admitting: Family Medicine

## 2014-06-15 ENCOUNTER — Ambulatory Visit (INDEPENDENT_AMBULATORY_CARE_PROVIDER_SITE_OTHER): Payer: BLUE CROSS/BLUE SHIELD | Admitting: Family Medicine

## 2014-06-15 VITALS — BP 143/96 | HR 71 | Ht 68.0 in | Wt 230.0 lb

## 2014-06-15 DIAGNOSIS — G4726 Circadian rhythm sleep disorder, shift work type: Secondary | ICD-10-CM | POA: Insufficient documentation

## 2014-06-15 DIAGNOSIS — G4700 Insomnia, unspecified: Secondary | ICD-10-CM | POA: Diagnosis not present

## 2014-06-15 DIAGNOSIS — Z8042 Family history of malignant neoplasm of prostate: Secondary | ICD-10-CM

## 2014-06-15 DIAGNOSIS — Z0000 Encounter for general adult medical examination without abnormal findings: Secondary | ICD-10-CM | POA: Diagnosis not present

## 2014-06-15 DIAGNOSIS — R7303 Prediabetes: Secondary | ICD-10-CM

## 2014-06-15 HISTORY — DX: Circadian rhythm sleep disorder, shift work type: G47.26

## 2014-06-15 LAB — CBC
HEMATOCRIT: 42.7 % (ref 39.0–52.0)
Hemoglobin: 14.9 g/dL (ref 13.0–17.0)
MCH: 30.7 pg (ref 26.0–34.0)
MCHC: 34.9 g/dL (ref 30.0–36.0)
MCV: 87.9 fL (ref 78.0–100.0)
MPV: 10.3 fL (ref 8.6–12.4)
Platelets: 303 10*3/uL (ref 150–400)
RBC: 4.86 MIL/uL (ref 4.22–5.81)
RDW: 13.5 % (ref 11.5–15.5)
WBC: 9.1 10*3/uL (ref 4.0–10.5)

## 2014-06-15 LAB — HEMOGLOBIN A1C
Hgb A1c MFr Bld: 6 % — ABNORMAL HIGH (ref <5.7)
MEAN PLASMA GLUCOSE: 126 mg/dL — AB (ref <117)

## 2014-06-15 LAB — COMPREHENSIVE METABOLIC PANEL
ALK PHOS: 74 U/L (ref 39–117)
ALT: 34 U/L (ref 0–53)
AST: 16 U/L (ref 0–37)
Albumin: 4.3 g/dL (ref 3.5–5.2)
BUN: 17 mg/dL (ref 6–23)
CALCIUM: 9.4 mg/dL (ref 8.4–10.5)
CHLORIDE: 102 meq/L (ref 96–112)
CO2: 29 mEq/L (ref 19–32)
Creat: 0.9 mg/dL (ref 0.50–1.35)
Glucose, Bld: 101 mg/dL — ABNORMAL HIGH (ref 70–99)
POTASSIUM: 4.4 meq/L (ref 3.5–5.3)
Sodium: 138 mEq/L (ref 135–145)
Total Bilirubin: 0.5 mg/dL (ref 0.2–1.2)
Total Protein: 7.1 g/dL (ref 6.0–8.3)

## 2014-06-15 LAB — LIPID PANEL
CHOLESTEROL: 173 mg/dL (ref 0–200)
HDL: 33 mg/dL — AB (ref >=40)
LDL CALC: 96 mg/dL (ref 0–99)
Total CHOL/HDL Ratio: 5.2 Ratio
Triglycerides: 220 mg/dL — ABNORMAL HIGH (ref <150)
VLDL: 44 mg/dL — ABNORMAL HIGH (ref 0–40)

## 2014-06-15 MED ORDER — HYDROXYZINE HCL 25 MG PO TABS: 25.0000 mg | ORAL_TABLET | Freq: Every evening | ORAL | Status: DC | PRN

## 2014-06-15 NOTE — Progress Notes (Signed)
CC: Jerry Miller is a 49 y.o. male is here for Annual Exam   Subjective: HPI:  Colonoscopy: no family history of colon cancer will begin screening at age 39 Prostate: Discussed screening risks/beneifts with patient today, he has a family history of prostate cancer and has been getting PSAs annually, repeat PSA today.  Influenza Vaccine: no current indication Pneumovax: no current indication Td/Tdap: UTD 2013 Zoster: (Start 47 yo)  Requesting complete physical exam.  His only complaint is difficulty falling asleep. He works the third shift and on days that he is not working he has difficulty falling asleep at his usual time early in the morning. Over-the-counter sleep medicines and melatonin have not been beneficial. He wants to note if there is something prescription strength.  Review of Systems - General ROS: negative for - chills, fever, night sweats, weight gain or weight loss Ophthalmic ROS: negative for - decreased vision Psychological ROS: negative for - anxiety or depression ENT ROS: negative for - hearing change, nasal congestion, tinnitus or allergies Hematological and Lymphatic ROS: negative for - bleeding problems, bruising or swollen lymph nodes Breast ROS: negative Respiratory ROS: no cough, shortness of breath, or wheezing Cardiovascular ROS: no chest pain or dyspnea on exertion Gastrointestinal ROS: no abdominal pain, change in bowel habits, or black or bloody stools Genito-Urinary ROS: negative for - genital discharge, genital ulcers, incontinence or abnormal bleeding from genitals Musculoskeletal ROS: negative for - joint pain or muscle pain Neurological ROS: negative for - headaches or memory loss Dermatological ROS: negative for lumps, mole changes, rash and skin lesion changes  Past Medical History  Diagnosis Date  . Fatigue   . Obesity (BMI 30.0-34.9)     Past Surgical History  Procedure Laterality Date  . Knee arthroscopy w/ acl reconstruction and  patella graft    . Kidney stone surgery     Family History  Problem Relation Age of Onset  . Diabetes Mother   . Diabetes Father   . Heart failure Father   . Nephrolithiasis Sister     History   Social History  . Marital Status: Married    Spouse Name: N/A  . Number of Children: N/A  . Years of Education: N/A   Occupational History  . Not on file.   Social History Main Topics  . Smoking status: Never Smoker   . Smokeless tobacco: Never Used  . Alcohol Use: Not on file  . Drug Use: Not on file  . Sexual Activity: Yes    Birth Control/ Protection: Implant   Other Topics Concern  . Not on file   Social History Narrative     Objective: BP 143/96 mmHg  Pulse 71  Ht 5' 8" (1.727 m)  Wt 230 lb (104.327 kg)  BMI 34.98 kg/m2  General: No Acute Distress HEENT: Atraumatic, normocephalic, conjunctivae normal without scleral icterus.  No nasal discharge, hearing grossly intact, TMs with good landmarks bilaterally with no middle ear abnormalities, posterior pharynx clear without oral lesions. Neck: Supple, trachea midline, no cervical nor supraclavicular adenopathy. Pulmonary: Clear to auscultation bilaterally without wheezing, rhonchi, nor rales. Cardiac: Regular rate and rhythm.  No murmurs, rubs, nor gallops. No peripheral edema.  2+ peripheral pulses bilaterally. Abdomen: Bowel sounds normal.  No masses.  Non-tender without rebound.  Negative Murphy's sign. LN:[id number] descended testes with no inguinal hernia MSK: Grossly intact, no signs of weakness.  Full strength throughout upper and lower extremities.  Full ROM in upper and lower extremities.  No midline spinal tenderness.  Neuro: Gait unremarkable, CN II-XII grossly intact.  C5-C6 Reflex 2/4 Bilaterally, L4 Reflex 2/4 Bilaterally.  Cerebellar function intact. Skin: No rashes. Psych: Alert and oriented to person/place/time.  Thought process normal. No anxiety/depression. Assessment & Plan: Jerry Miller was seen today for  annual exam.  Diagnoses and all orders for this visit:  Family history of prostate cancer Orders: -     PSA -     Comp Met (CMET) -     CBC -     Lipid panel  Prediabetes Orders: -     PSA -     Comp Met (CMET) -     CBC -     Lipid panel -     Hemoglobin A1c  Annual physical exam Orders: -     PSA -     Comp Met (CMET) -     CBC -     Lipid panel  Insomnia  Other orders -     hydrOXYzine (ATARAX/VISTARIL) 25 MG tablet; Take 1 tablet (25 mg total) by mouth at bedtime as needed (sleep).   Insomnia: Trial of hydroxyzine on a when necessary basis Healthy lifestyle interventions including but not limited to regular exercise, a healthy low fat diet, moderation of salt intake, the dangers of tobacco/alcohol/recreational drug use, nutrition supplementation, and accident avoidance were discussed with the patient and a handout was provided for future reference.  Return in about 3 months (around 09/14/2014) for prediabetes and weight.

## 2014-06-15 NOTE — Patient Instructions (Signed)
Psyllium Powder 1-3 heaping teaspoons daily mixed with water to help with bowel movements.  Plan on doing this for at least two weeks.    Dr. Genelle Bal General Advice Following Your Complete Physical Exam  The Benefits of Regular Exercise: Unless you suffer from an uncontrolled cardiovascular condition, studies strongly suggest that regular exercise and physical activity will add to both the quality and length of your life.  The World Health Organization recommends 150 minutes of moderate intensity aerobic activity every week.  This is best split over 3-4 days a week, and can be as simple as a brisk walk for just over 35 minutes "most days of the week".  This type of exercise has been shown to lower LDL-Cholesterol, lower average blood sugars, lower blood pressure, lower cardiovascular disease risk, improve memory, and increase one's overall sense of wellbeing.  The addition of anaerobic (or "strength training") exercises offers additional benefits including but not limited to increased metabolism, prevention of osteoporosis, and improved overall cholesterol levels.  How Can I Strive For A Low-Fat Diet?: Current guidelines recommend that 25-35 percent of your daily energy (food) intake should come from fats.  One might ask how can this be achieved without having to dissect each meal on a daily basis?  Switch to skim or 1% milk instead of whole milk.  Focus on lean meats such as ground [place], fresh fish, baked chicken, and lean cuts of beef as your source of dietary protein.  Limit saturated fat consumption to less than 10% of your daily caloric intake.  Limit trans fatty acid consumption primarily by limiting synthetic trans fats such as partially hydrogenated oils (Ex: fried fast foods).  Substitute olive or vegetable oil for solid fats where possible.  Moderation of Salt Intake: Provided you don't carry a diagnosis of congestive heart failure nor renal failure, I recommend a daily allowance of no  more than 2300 mg of salt (sodium).  Keeping under this daily goal is associated with a decreased risk of cardiovascular events, creeping above it can lead to elevated blood pressures and increases your risk of cardiovascular events.  Milligrams (mg) of salt is listed on all nutrition labels, and your daily intake can add up faster than you think.  Most canned and frozen dinners can pack in over half your daily salt allowance in one meal.    Lifestyle Health Risks: Certain lifestyle choices carry specific health risks.  As you may already know, tobacco use has been associated with increasing one's risk of cardiovascular disease, pulmonary disease, numerous cancers, among many other issues.  What you may not know is that there are medications and nicotine replacement strategies that can more than double your chances of successfully quitting.  I would be thrilled to help manage your quitting strategy if you currently use tobacco products.  When it comes to alcohol use, I've yet to find an "ideal" daily allowance.  Provided an individual does not have a medical condition that is exacerbated by alcohol consumption, general guidelines determine "safe drinking" as no more than two standard drinks for a man or no more than one standard drink for a male per day.  However, much debate still exists on whether any amount of alcohol consumption is technically "safe".  My general advice, keep alcohol consumption to a minimum for general health promotion.  If you or others believe that alcohol, tobacco, or recreational drug use is interfering with your life, I would be happy to provide confidential counseling regarding treatment options.  General "Over The Counter" Nutrition Advice: Postmenopausal women should aim for a daily calcium intake of 1200 mg, however a significant portion of this might already be provided by diets including milk, yogurt, cheese, and other dairy products.  Vitamin D has been shown to help preserve  bone density, prevent fatigue, and has even been shown to help reduce falls in the elderly.  Ensuring a daily intake of 800 Units of Vitamin D is a good place to start to enjoy the above benefits, we can easily check your Vitamin D level to see if you'd potentially benefit from supplementation beyond 800 Units a day.  Folic Acid intake should be of particular concern to women of childbearing age.  Daily consumption of 400-800 mcg of Folic Acid is recommended to minimize the chance of spinal cord defects in a fetus should pregnancy occur.    For many adults, accidents still remain one of the most common culprits when it comes to cause of death.  Some of the simplest but most effective preventitive habits you can adopt include regular seatbelt use, proper helmet use, securing firearms, and regularly testing your smoke and carbon monoxide detectors.  Dilon Lank [facility] DO [facility] [address], [address] [place], [place] [address] Phone: [phone]

## 2014-06-16 ENCOUNTER — Telehealth: Payer: Self-pay | Admitting: Family Medicine

## 2014-06-16 LAB — PSA: PSA: 2.47 ng/mL (ref <=4.00)

## 2014-06-16 MED ORDER — FISH OIL 1000 MG PO CAPS: ORAL_CAPSULE | ORAL | Status: DC

## 2014-06-16 NOTE — Telephone Encounter (Signed)
Sue Lushndrea, Will you please let patient know that his PSA, kidney function, liver function, and blood cell counts were all normal.  His A1c was 6.0 with a goal of less than 7.0.  It is in the prediabetic range but not high enough to warrant blood sugar medication.  LDL cholesterol is normal.  Triglycerides were mildly elevated which can lead to pancreatic and liver inflammation over time.  I'd recommend he start a 1g OTC fish oil supplement twice a day with meals to help lower these triglycerides.  PSA prostate test was normal.  I'd recommend f/u in 3 months for blood pressure and sugar reassessment.

## 2014-06-16 NOTE — Telephone Encounter (Signed)
Left message on patient vm to call office back regarding lab results. Caydence Koenig,CMA

## 2014-06-19 NOTE — Telephone Encounter (Signed)
Pt.notified

## 2014-08-14 ENCOUNTER — Other Ambulatory Visit: Payer: Self-pay | Admitting: Family Medicine

## 2014-09-11 ENCOUNTER — Other Ambulatory Visit: Payer: Self-pay | Admitting: Family Medicine

## 2014-09-11 NOTE — Telephone Encounter (Signed)
Spoke with Pharmacy, they did receive Rx. No error.

## 2015-01-09 ENCOUNTER — Other Ambulatory Visit: Payer: Self-pay | Admitting: Family Medicine

## 2015-04-11 ENCOUNTER — Ambulatory Visit (INDEPENDENT_AMBULATORY_CARE_PROVIDER_SITE_OTHER): Payer: Managed Care, Other (non HMO) | Admitting: Family Medicine

## 2015-04-11 ENCOUNTER — Encounter: Payer: Self-pay | Admitting: Family Medicine

## 2015-04-11 VITALS — BP 157/96 | HR 57 | Temp 98.4°F | Wt 236.0 lb

## 2015-04-11 DIAGNOSIS — Z2089 Contact with and (suspected) exposure to other communicable diseases: Secondary | ICD-10-CM | POA: Diagnosis not present

## 2015-04-11 DIAGNOSIS — R21 Rash and other nonspecific skin eruption: Secondary | ICD-10-CM | POA: Diagnosis not present

## 2015-04-11 DIAGNOSIS — Z20818 Contact with and (suspected) exposure to other bacterial communicable diseases: Secondary | ICD-10-CM

## 2015-04-11 MED ORDER — CLOTRIMAZOLE 1 % EX CREA: TOPICAL_CREAM | CUTANEOUS | Status: AC

## 2015-04-11 MED ORDER — AMOXICILLIN 500 MG PO CAPS: 500.0000 mg | ORAL_CAPSULE | Freq: Two times a day (BID) | ORAL | Status: DC

## 2015-04-11 NOTE — Progress Notes (Signed)
CC: Jerry Miller is a 50 y.o. male is here for Sore Throat; Nasal Congestion; and Cough   Subjective: HPI:  Son and wife were diagnosed with strep throat earlier this week. Patient developed a moderate sore throat last night  And had a cough earlier this month but it's no longer present. It's worse when swallowing. No benefit from over-the-counter cough and cold medication. No other interventions as of yet. It slowly worsening since onset last night. Denies fevers, chills, confusion,  Joint pain or pulmonary complaints.  He also has a rash on his left chest that's been present for the last month. Started off as a few tiny little bumps and has slowly been spreading. It's red, scaly, and itchy. The more he peels off the scales that faster it seems to come back. He denies any pain. He denies rashes elsewhere or exposure to pets.   Review Of Systems Outlined In HPI  Past Medical History  Diagnosis Date  . Fatigue   . Obesity (BMI 30.0-34.9)     Past Surgical History  Procedure Laterality Date  . Knee arthroscopy w/ acl reconstruction and patella graft    . Kidney stone surgery     Family History  Problem Relation Age of Onset  . Diabetes Mother   . Diabetes Father   . Heart failure Father   . Nephrolithiasis Sister     Social History   Social History  . Marital Status: Married    Spouse Name: N/A  . Number of Children: N/A  . Years of Education: N/A   Occupational History  . Not on file.   Social History Main Topics  . Smoking status: Never Smoker   . Smokeless tobacco: Never Used  . Alcohol Use: Not on file  . Drug Use: Not on file  . Sexual Activity: Yes    Birth Control/ Protection: Implant   Other Topics Concern  . Not on file   Social History Narrative     Objective: BP 157/96 mmHg  Pulse 57  Temp(Src) 98.4 F (36.9 C) (Oral)  Wt 236 lb (107.049 kg)  General: Alert and Oriented, No Acute Distress HEENT: Pupils equal, round, reactive to light.  Conjunctivae clear.  External ears unremarkable, canals clear with intact TMs with appropriate landmarks.  Middle ear appears open without effusion. Pink inferior turbinates.  Moist mucous membranes, pharynx without inflammation nor lesions.  Neck supple without palpable lymphadenopathy nor abnormal masses.uvula is midline however has petechiae Lungs: Clear to auscultation bilaterally, no wheezing/ronchi/rales.  Comfortable work of breathing. Good air movement. Mental Status: No depression, anxiety, nor agitation. Skin: Warm and dry. 2 similar by 3 cm elliptical erythematous rash on the left chest just below the clavicle with peripheral coalescent papules with the central region with moderate scaling. No telangectasia   Assessment & Plan: Jerry Miller was seen today for sore throat, nasal congestion and cough.  Diagnoses and all orders for this visit:  Strep throat exposure -     amoxicillin (AMOXIL) 500 MG capsule; Take 1 capsule (500 mg total) by mouth 2 (two) times daily.  Rash and nonspecific skin eruption -     clotrimazole (LOTRIMIN) 1 % cream; Apply to affected areas twice a day for up to four weeks, applying up to two weeks after resolution of symptoms.   Strep exposure with sore throat seems reasonable to start with amoxicillin especially given petechiae on uvula. Rashes suspicious for tinea corpus therefore start clotrimazole cream and if no better after one week return  for punch biopsy  Return if symptoms worsen or fail to improve.

## 2015-04-18 ENCOUNTER — Encounter: Payer: Self-pay | Admitting: Family Medicine

## 2015-04-18 ENCOUNTER — Ambulatory Visit (INDEPENDENT_AMBULATORY_CARE_PROVIDER_SITE_OTHER): Payer: Managed Care, Other (non HMO) | Admitting: Family Medicine

## 2015-04-18 VITALS — BP 143/91 | HR 72 | Wt 235.0 lb

## 2015-04-18 DIAGNOSIS — L989 Disorder of the skin and subcutaneous tissue, unspecified: Secondary | ICD-10-CM | POA: Diagnosis not present

## 2015-04-18 MED ORDER — PREDNISONE 20 MG PO TABS: ORAL_TABLET | ORAL | Status: DC

## 2015-04-18 NOTE — Progress Notes (Signed)
CC: Jerry Miller is a 50 y.o. male is here for Rash   Subjective: HPI:  For about a week now he's been using clotrimazole on the lesion involving his left chest. He does not think it's helping at all. The lesion is not getting any larger but it still itches and is red. He denies any pain. No other interventions as of yet. He denies skin lesions elsewhere. No fevers, chills or unintentional weight loss. Denies swollen lymph nodes.   Review Of Systems Outlined In HPI  Past Medical History  Diagnosis Date  . Fatigue   . Obesity (BMI 30.0-34.9)     Past Surgical History  Procedure Laterality Date  . Knee arthroscopy w/ acl reconstruction and patella graft    . Kidney stone surgery     Family History  Problem Relation Age of Onset  . Diabetes Mother   . Diabetes Father   . Heart failure Father   . Nephrolithiasis Sister     Social History   Social History  . Marital Status: Married    Spouse Name: N/A  . Number of Children: N/A  . Years of Education: N/A   Occupational History  . Not on file.   Social History Main Topics  . Smoking status: Never Smoker   . Smokeless tobacco: Never Used  . Alcohol Use: Not on file  . Drug Use: Not on file  . Sexual Activity: Yes    Birth Control/ Protection: Implant   Other Topics Concern  . Not on file   Social History Narrative     Objective: BP 143/91 mmHg  Pulse 72  Wt 235 lb (106.595 kg)  Vital signs reviewed. General: Alert and Oriented, No Acute Distress HEENT: Pupils equal, round, reactive to light. Conjunctivae clear.  External ears unremarkable.  Moist mucous membranes. Lungs: Clear and comfortable work of breathing, speaking in full sentences without accessory muscle use. Cardiac: Regular rate and rhythm.  Neuro: CN II-XII grossly intact, gait normal. Extremities: No peripheral edema.  Strong peripheral pulses.  Mental Status: No depression, anxiety, nor agitation. Logical though process. Skin: Warm and dry.  Flaky, erythematous, slightly raised elliptical lesion on the left chest unchanged from last visit.     \ Assessment & Plan: Terrance was seen today for rash.  Diagnoses and all orders for this visit:  Skin lesion -     predniSONE (DELTASONE) 20 MG tablet; Three tabs daily days 1-3, two tabs daily days 4-6, one tab daily days 7-9, half tab daily days 10-13. -     Dermatology pathology   Suspicion for nummular eczema still remains high on the differential, start prednisone taper today and obtain biopsy for definitive diagnosis. Shave Biopsy Procedure Note  Pre-operative Diagnosis: suspicious lesion  Post-operative Diagnosis: same  Locations:left chest  Indications: rule out SCC or BCC  Anesthesia: 1% Lidocaine with Epi  Procedure Details  History of allergy to iodine: no  Patient informed of the risks (including bleeding and infection) and benefits of the  procedure and Verbal informed consent obtained.  The lesion and surrounding area were given a sterile prep using chlorhexidine and draped in the usual sterile fashion. A scalpel was used to shave an area of skin approximately 0.7cm by 0.7cm.  Hemostasis achieved with pressure. Antibiotic ointment and a sterile dressing applied.  The specimen was sent for pathologic examination. The patient tolerated the procedure well.  EBL: 1 ml  Findings: success  Condition: Stable  Complications: none.  Plan: 1. Instructed to  keep the wound dry and covered for 24-48h and clean thereafter. 2. Warning signs of infection were reviewed.   3. Recommended that the patient use OTC analgesics as needed for pain.  4. Return PRN  No Follow-up on file.

## 2015-04-20 ENCOUNTER — Telehealth: Payer: Self-pay | Admitting: Family Medicine

## 2015-04-20 MED ORDER — CLOBETASOL PROPIONATE 0.05 % EX CREA: 1.0000 "application " | TOPICAL_CREAM | Freq: Two times a day (BID) | CUTANEOUS | Status: DC

## 2015-04-20 NOTE — Telephone Encounter (Signed)
Awaiting call back.

## 2015-04-20 NOTE — Telephone Encounter (Signed)
Pt.notified

## 2015-04-20 NOTE — Telephone Encounter (Signed)
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 Please call me in two weeks if the rash has not begun to resolve.

## 2015-05-13 ENCOUNTER — Encounter: Payer: Self-pay | Admitting: Emergency Medicine

## 2015-05-13 ENCOUNTER — Emergency Department
Admission: EM | Admit: 2015-05-13 | Discharge: 2015-05-13 | Disposition: A | Discharge status: Home / Self Care | Payer: Managed Care, Other (non HMO) | Source: Home / Self Care | Attending: Family Medicine | Admitting: Family Medicine

## 2015-05-13 DIAGNOSIS — Z20818 Contact with and (suspected) exposure to other bacterial communicable diseases: Secondary | ICD-10-CM

## 2015-05-13 DIAGNOSIS — Z2089 Contact with and (suspected) exposure to other communicable diseases: Secondary | ICD-10-CM | POA: Diagnosis not present

## 2015-05-13 DIAGNOSIS — J029 Acute pharyngitis, unspecified: Secondary | ICD-10-CM | POA: Diagnosis not present

## 2015-05-13 DIAGNOSIS — R0982 Postnasal drip: Secondary | ICD-10-CM | POA: Diagnosis not present

## 2015-05-13 LAB — POCT RAPID STREP A (OFFICE): Rapid Strep A Screen: NEGATIVE

## 2015-05-13 MED ORDER — AMOXICILLIN 500 MG PO CAPS: 500.0000 mg | ORAL_CAPSULE | Freq: Two times a day (BID) | ORAL | Status: DC

## 2015-05-13 NOTE — Discharge Instructions (Signed)
You may take 400-600mg  Ibuprofen (Motrin) every 6-8 hours for fever and pain  Alternate with Tylenol  You may take 500mg  Tylenol every 4-6 hours as needed for fever and pain  You may also try over the counter Sudafed, Dayquil or Nyquil, and nasal salines or over the counter nasal decongestants such as Afrin (no longer than 3 consecutive days) for nasal congestion and post-nasal drip. Follow-up with your primary care provider next week for recheck of symptoms if not improving.  Be sure to drink plenty of fluids and rest, at least 8hrs of sleep a night, preferably more while you are sick. Return urgent care or go to closest ER if you cannot keep down fluids/signs of dehydration, fever not reducing with Tylenol, difficulty breathing/wheezing, stiff neck, worsening condition, or other concerns (see below)   Your symptoms are likely due to a virus such as the common cold as adults typically do not get strep throat since they have had as children and generally produce strong immune system against the bacteria, however, if the strep culture comes back Positive, persistent fever for 3 days, or symptoms not improving in 4-5 days, you may fill the antibiotic (amoxicillin).  If you do fill the antibiotic,  please take antibiotics as prescribed and be sure to complete entire course even if you start to feel better to ensure infection does not come back.

## 2015-05-13 NOTE — ED Notes (Signed)
Patient C/O sore throat since last PM with post nasal drip since Friday states no fever although his son has strep at home.

## 2015-05-13 NOTE — ED Provider Notes (Signed)
CSN: [id number]     Arrival date & time 05/13/15  1135 History   First MD Initiated Contact with Patient 05/13/15 1201     Chief Complaint  Patient presents with  . Sore Throat   (Consider location/radiation/quality/duration/timing/severity/associated sxs/prior Treatment) HPI The pt is a 49yo male presenting to [facility] with c/o mild sore throat 2/10 at worst, associated post-nasal drip and mild intermittent non-productive cough. Pt's son recently dx for 3rd time with strep throat. Pt was put on amoxicillin last month for same due to exposure to strep and a moderately severe sore throat at that time. Denies fever, chills, n/v/d. He has not tried anything for his symptoms. He leaves to go out of town for 4-5 days starting tomorrow and is concerned he may need an antibiotic.   Past Medical History  Diagnosis Date  . Fatigue   . Obesity (BMI 30.0-34.9)    Past Surgical History  Procedure Laterality Date  . Knee arthroscopy w/ acl reconstruction and patella graft    . Kidney stone surgery     Family History  Problem Relation Age of Onset  . Diabetes Mother   . Diabetes Father   . Heart failure Father   . Nephrolithiasis Sister    Social History  Substance Use Topics  . Smoking status: Never Smoker   . Smokeless tobacco: Never Used  . Alcohol Use: None    Review of Systems  Constitutional: Negative for fever and chills.  HENT: Positive for postnasal drip and sore throat. Negative for congestion, ear pain, trouble swallowing and voice change.   Respiratory: Positive for cough. Negative for shortness of breath.   Cardiovascular: Negative for chest pain and palpitations.  Gastrointestinal: Negative for nausea, vomiting, abdominal pain and diarrhea.  Musculoskeletal: Negative for myalgias, back pain and arthralgias.  Skin: Negative for rash.  Neurological: Positive for headaches. Negative for dizziness and light-headedness.    Allergies  Review of patient's allergies indicates no  known allergies.  Home Medications   Prior to Admission medications   Medication Sig Start Date End Date Taking? Authorizing Provider  amoxicillin (AMOXIL) 500 MG capsule Take 1 capsule (500 mg total) by mouth 2 (two) times daily. For 10 days 05/13/15   Junius Finner, PA-C  clobetasol cream (TEMOVATE) 0.05 % Apply 1 application topically 2 (two) times daily. Use until rash resolves. 04/20/15   Laren Boom, DO  hydrOXYzine (ATARAX/VISTARIL) 25 MG tablet TAKE 1 TABLET BY MOUTH AT BEDTIME AS NEEDED 09/11/14   Laren Boom, DO  Omega-3 Fatty Acids (FISH OIL) 1000 MG CAPS One PO BID with meals 06/16/14   Sean Hommel, DO  omeprazole (PRILOSEC) 40 MG capsule TAKE ONE CAPSULE BY MOUTH ONE TIME DAILY 01/09/15   Laren Boom, DO   Meds Ordered and Administered this Visit  Medications - No data to display  BP 145/94 mmHg  Pulse 79  Temp(Src) 97.9 F (36.6 C) (Oral)  Resp 16  Ht  (1.727 m)  Wt 230 lb (104.327 kg)  BMI 34.98 kg/m2  SpO2 96% No data found.   Physical Exam  Constitutional: He appears well-developed and well-nourished.  HENT:  Head: Normocephalic and atraumatic.  Right Ear: Tympanic membrane normal.  Left Ear: Tympanic membrane normal.  Nose: Nose normal.  Mouth/Throat: Uvula is midline and mucous membranes are normal. Posterior oropharyngeal erythema present. No oropharyngeal exudate, posterior oropharyngeal edema or tonsillar abscesses.  Eyes: Conjunctivae are normal. No scleral icterus.  Neck: Normal range of motion. Neck supple.  Cardiovascular: Normal  rate, regular rhythm and normal heart sounds.   Pulmonary/Chest: Effort normal and breath sounds normal. No stridor. No respiratory distress. He has no wheezes. He has no rales. He exhibits no tenderness.  Abdominal: Soft. He exhibits no distension. There is no tenderness.  Musculoskeletal: Normal range of motion.  Lymphadenopathy:    He has no cervical adenopathy.  Neurological: He is alert.  Skin: Skin is warm and dry.   Nursing note and vitals reviewed.   ED Course  Procedures (including critical care time)  Labs Review Labs Reviewed - No data to display  Imaging Review No results found.    MDM   1. Sore throat   2. Post-nasal drip   3. Strep throat exposure     Pt c/o sore throat. Exposure to strep. Rapid strep: negative Reassured pt throat pain is likely from postnasal drip. Encouraged symptomatic treatment with salt water gargles, OTC Afrin, sudafed, dayquil or nyquil.  Will send culture. Prescription to hold for amoxicillin provided with expiration date. Encouraged not to fill unless persistent fever develops or culture comes back positive.  F/u with PCP in 1 week if symptoms persist or keep returning. Patient verbalized understanding and agreement with treatment plan.     Junius Finnerrin O'Malley, PA-C 05/13/15 1235

## 2015-10-10 ENCOUNTER — Encounter: Payer: Self-pay | Admitting: Family Medicine

## 2015-10-10 ENCOUNTER — Ambulatory Visit (INDEPENDENT_AMBULATORY_CARE_PROVIDER_SITE_OTHER): Payer: Managed Care, Other (non HMO) | Admitting: Family Medicine

## 2015-10-10 VITALS — BP 174/115 | HR 64 | Wt 233.0 lb

## 2015-10-10 DIAGNOSIS — L237 Allergic contact dermatitis due to plants, except food: Secondary | ICD-10-CM

## 2015-10-10 DIAGNOSIS — I10 Essential (primary) hypertension: Secondary | ICD-10-CM

## 2015-10-10 HISTORY — DX: Essential (primary) hypertension: I10

## 2015-10-10 MED ORDER — HYDROCHLOROTHIAZIDE 25 MG PO TABS: ORAL_TABLET | ORAL | 2 refills | Status: DC

## 2015-10-10 MED ORDER — CLOBETASOL PROPIONATE 0.05 % EX CREA: 1.0000 "application " | TOPICAL_CREAM | Freq: Two times a day (BID) | CUTANEOUS | 3 refills | Status: DC

## 2015-10-10 MED ORDER — PREDNISONE 20 MG PO TABS: ORAL_TABLET | ORAL | 0 refills | Status: AC

## 2015-10-10 NOTE — Progress Notes (Signed)
CC: Jerry CasterRobert Miller is a 50 y.o. male is here for Rash   Subjective: HPI:  6 days ago he broke out in a rash involving only his forearms. It's been present for 6 days and slowly worsening. It's incredibly itchy but not painful. No benefit from calamine lotion. No other interventions as of yet. He works outside 4 days a week and was knowingly exposed to multiple vines possibly poison ivy. Denies skin lesions elsewhere. No fevers, chills or swollen lymph nodes.  He's noticed that his blood pressure slowly rising over the past 2 years. He has a strong family history of hypertension. He's never been on any medication for this before. He tells me there's a great room for improvement with reducing sodium in his diet. No formal physical activity. Denies chest pain shortness of breath orthopnea nor edema.   Review Of Systems Outlined In HPI  Past Medical History:  Diagnosis Date  . Fatigue   . Obesity (BMI 30.0-34.9)     Past Surgical History:  Procedure Laterality Date  . KIDNEY STONE SURGERY    . KNEE ARTHROSCOPY W/ ACL RECONSTRUCTION AND PATELLA GRAFT     Family History  Problem Relation Age of Onset  . Diabetes Mother   . Diabetes Father   . Heart failure Father   . Nephrolithiasis Sister     Social History   Social History  . Marital status: Married    Spouse name: N/A  . Number of children: N/A  . Years of education: N/A   Occupational History  . Not on file.   Social History Main Topics  . Smoking status: Never Smoker  . Smokeless tobacco: Never Used  . Alcohol use Not on file  . Drug use: Unknown  . Sexual activity: Yes    Birth control/ protection: Implant   Other Topics Concern  . Not on file   Social History Narrative  . No narrative on file     Objective: BP (!) 174/115   Pulse 64   Wt 233 lb (105.7 kg)   BMI 35.43 kg/m   General: Alert and Oriented, No Acute Distress HEENT: Pupils equal, round, reactive to light. Conjunctivae clear. Moist mucous  membranes Lungs: Clear to auscultation bilaterally, no wheezing/ronchi/rales.  Comfortable work of breathing. Good air movement. Cardiac: Regular rate and rhythm. Normal S1/S2.  No murmurs, rubs, nor gallops.   Extremities: No peripheral edema.  Strong peripheral pulses.  Mental Status: No depression, anxiety, nor agitation. Skin: Warm and dry. Maculopapular rash with scant small vesicles involving the dorsal and volar aspect of both forearms, these are grouped in clusters and streaks.  Assessment & Plan: Jerry Miller was seen today for rash.  Diagnoses and all orders for this visit:  Poison ivy  Essential hypertension  Other orders -     predniSONE (DELTASONE) 20 MG tablet; Three tabs daily days 1-3, two tabs daily days 4-6, one tab daily days 7-9, half tab daily days 10-13. -     clobetasol cream (TEMOVATE) 0.05 %; Apply 1 application topically 2 (two) times daily. Use until rash resolves. -     hydrochlorothiazide (HYDRODIURIL) 25 MG tablet; One tablet by mouth every morning for blood pressure control.   Poison ivy: Start prednisone taper with topical clobetasol Essential hypertension: Chronic uncontrolled condition starting HCTZ follow-up in 4 weeks.  Return in about 4 weeks (around 11/07/2015).  Discussed with this patient that I will be resigning from my position here with [facility] in September in order to  stay with my family who will be moving to [facility]. I let him know about the providers that are still accepting patients and I feel that this individual will be under great care if he/she stays here with [facility].

## 2015-10-29 ENCOUNTER — Ambulatory Visit: Payer: Managed Care, Other (non HMO) | Admitting: Family Medicine

## 2015-10-31 ENCOUNTER — Encounter: Payer: Self-pay | Admitting: Family Medicine

## 2015-10-31 ENCOUNTER — Ambulatory Visit (INDEPENDENT_AMBULATORY_CARE_PROVIDER_SITE_OTHER): Payer: Managed Care, Other (non HMO) | Admitting: Family Medicine

## 2015-10-31 VITALS — BP 130/89 | HR 70 | Wt 229.0 lb

## 2015-10-31 DIAGNOSIS — Z23 Encounter for immunization: Secondary | ICD-10-CM

## 2015-10-31 DIAGNOSIS — I10 Essential (primary) hypertension: Secondary | ICD-10-CM

## 2015-10-31 DIAGNOSIS — R7303 Prediabetes: Secondary | ICD-10-CM | POA: Diagnosis not present

## 2015-10-31 LAB — POCT GLYCOSYLATED HEMOGLOBIN (HGB A1C): Hemoglobin A1C: 6.1

## 2015-10-31 MED ORDER — HYDROCHLOROTHIAZIDE 25 MG PO TABS: ORAL_TABLET | ORAL | 1 refills | Status: DC

## 2015-10-31 MED ORDER — OMEPRAZOLE 40 MG PO CPDR: 40.0000 mg | DELAYED_RELEASE_CAPSULE | Freq: Every day | ORAL | 1 refills | Status: DC

## 2015-10-31 NOTE — Progress Notes (Signed)
CC: [name] is a 50 y.o. male is here for Hypertension   Subjective: HPI:  Follow-up essential hypertension: Since starting on hydrochlorothiazide he's not noticed any new symptoms. He is also trying to reduce sodium in his diet. He denies chest pain shortness of breath orthopnea or peripheral edema.  Follow-up prediabetes: Trying to his best to reduce carbohydrates in his diet. Denies polyuria polyphagia or vision disturbance.   Review Of Systems Outlined In HPI  Past Medical History:  Diagnosis Date  . Fatigue   . Obesity (BMI 30.0-34.9)     Past Surgical History:  Procedure Laterality Date  . KIDNEY STONE SURGERY    . KNEE ARTHROSCOPY W/ ACL RECONSTRUCTION AND PATELLA GRAFT     Family History  Problem Relation Age of Onset  . Diabetes Mother   . Diabetes Father   . Heart failure Father   . Nephrolithiasis Sister     Social History   Social History  . Marital status: Married    Spouse name: N/A  . Number of children: N/A  . Years of education: N/A   Occupational History  . Not on file.   Social History Main Topics  . Smoking status: Never Smoker  . Smokeless tobacco: Never Used  . Alcohol use Not on file  . Drug use: Unknown  . Sexual activity: Yes    Birth control/ protection: Implant   Other Topics Concern  . Not on file   Social History Narrative  . No narrative on file     Objective: BP 130/89   Pulse 70   Wt 229 lb (103.9 kg)   BMI 34.82 kg/m   Vital signs reviewed. General: Alert and Oriented, No Acute Distress HEENT: Pupils equal, round, reactive to light. Conjunctivae clear.  External ears unremarkable.  Moist mucous membranes. Lungs: Clear and comfortable work of breathing, speaking in full sentences without accessory muscle use. Cardiac: Regular rate and rhythm.  Neuro: CN II-XII grossly intact, gait normal. Extremities: No peripheral edema.  Strong peripheral pulses.  Mental Status: No depression, anxiety, nor agitation.  Logical though process. Skin: Warm and dry.  Assessment & Plan: Jerry Miller was seen today for hypertension.  Diagnoses and all orders for this visit:  Essential hypertension  Prediabetes -     POCT HgB A1C  Encounter for immunization -     hydrochlorothiazide (HYDRODIURIL) 25 MG tablet; One tablet by mouth every morning for blood pressure control. -     omeprazole (PRILOSEC) 40 MG capsule; Take 1 capsule (40 mg total) by mouth daily. -     Flu Vaccine QUAD 36+ mos IM   Essential hypertension: Huge improvement with hydrochlorothiazide will continue to take this dose indefinitely. Prediabetes: A1c of 6.1, control, repeat in 3-6 months   Return in about 3 months (around 01/31/2016) for First Dr. Denyse Amassorey Visit .

## 2016-01-28 ENCOUNTER — Encounter: Payer: Self-pay | Admitting: Family Medicine

## 2016-01-28 ENCOUNTER — Ambulatory Visit (INDEPENDENT_AMBULATORY_CARE_PROVIDER_SITE_OTHER): Payer: Managed Care, Other (non HMO) | Admitting: Family Medicine

## 2016-01-28 VITALS — BP 129/88 | HR 80 | Wt 237.0 lb

## 2016-01-28 DIAGNOSIS — I10 Essential (primary) hypertension: Secondary | ICD-10-CM | POA: Diagnosis not present

## 2016-01-28 DIAGNOSIS — E781 Pure hyperglyceridemia: Secondary | ICD-10-CM | POA: Diagnosis not present

## 2016-01-28 DIAGNOSIS — Z8042 Family history of malignant neoplasm of prostate: Secondary | ICD-10-CM

## 2016-01-28 DIAGNOSIS — R748 Abnormal levels of other serum enzymes: Secondary | ICD-10-CM | POA: Diagnosis not present

## 2016-01-28 DIAGNOSIS — E785 Hyperlipidemia, unspecified: Secondary | ICD-10-CM | POA: Diagnosis not present

## 2016-01-28 DIAGNOSIS — R7303 Prediabetes: Secondary | ICD-10-CM

## 2016-01-28 DIAGNOSIS — Z114 Encounter for screening for human immunodeficiency virus [HIV]: Secondary | ICD-10-CM

## 2016-01-28 MED ORDER — CLOBETASOL PROPIONATE 0.05 % EX CREA: 1.0000 "application " | TOPICAL_CREAM | Freq: Two times a day (BID) | CUTANEOUS | 12 refills | Status: DC

## 2016-01-28 NOTE — Patient Instructions (Signed)
Thank you for coming in today. Get labs fasting soon.  Return in 6-12 months.  Work on diet and weight loss.  Avoid carbs.   Call or go to the emergency room if you get worse, have trouble breathing, have chest pains, or palpitations.

## 2016-01-28 NOTE — Progress Notes (Signed)
Jerry CasterRobert Miller is a 50 y.o. male who [facility] today for follow up of HTN and prediabetes.  HTN: No complaints with HCTZ. Office BP good today; does not frequently check at home. Denies chest pain, SOB, leg swelling. He has not been exercising or eating healthy, but he is aware that he needs to make changes. He eats a lot of carbs and fast food. His right knee sometimes bothers him while trying to exercise (has had ACL and meniscus repair in the past), but he wants to avoid surgery if possible.  Prediabetes: Last A1C 6.2 three months ago. No new polyuria or polydipsia.  Eczema: He feels like clobetasol cream helped him, but the eczema has come back since running out. Previously on a course of oral prednisone for this.  Other: He has a chronic external hemorrhoid that is bothersome but not painful. He has chronic headaches managed with excedrin. He feels that he has become more farsighted recently and hopes to get his eyes checked soon.    Past Medical History:  Diagnosis Date  . Fatigue   . Obesity (BMI 30.0-34.9)    Past Surgical History:  Procedure Laterality Date  . KIDNEY STONE SURGERY    . KNEE ARTHROSCOPY W/ ACL RECONSTRUCTION AND PATELLA GRAFT     Social History  Substance Use Topics  . Smoking status: Never Smoker  . Smokeless tobacco: Never Used  . Alcohol use Not on file   family history includes Diabetes in his father and mother; Heart failure in his father; Nephrolithiasis in his sister.  ROS as above:  Medications: Current Outpatient Prescriptions  Medication Sig Dispense Refill  . clobetasol cream (TEMOVATE) 0.05 % Apply 1 application topically 2 (two) times daily. Use until rash resolves. 30 g 3  . hydrochlorothiazide (HYDRODIURIL) 25 MG tablet One tablet by mouth every morning for blood pressure control. 90 tablet 1  .  hydrOXYzine (ATARAX/VISTARIL) 25 MG tablet TAKE 1 TABLET BY MOUTH AT BEDTIME AS NEEDED 30 tablet 0  . Omega-3 Fatty Acids (FISH OIL) 1000 MG CAPS One PO BID with meals  0  . omeprazole (PRILOSEC) 40 MG capsule Take 1 capsule (40 mg total) by mouth daily. 90 capsule 1   No current facility-administered medications for this visit.    No Known Allergies  Health Maintenance Health Maintenance  Topic Date Due  . HIV Screening  01/24/1983  . TETANUS/TDAP  05/07/2021  . INFLUENZA VACCINE  Completed     Exam:  BP 129/88   Pulse 80   Wt 237 lb (107.5 kg)   BMI 36.04 kg/m  Gen: Well NAD HEENT: EOMI,  MMM Lungs: Normal work of breathing. CTABL Heart: RRR no MRG Abd: Obese, NABS, Soft, Nontender Exts: Brisk capillary refill, warm and well perfused.    No results found for this or any previous visit (from the past 72 hour(s)). No results found.    Assessment and Plan: 50 y.o. male with HTN and prediabetes.  HTN: Doing well; continue current therapy. Counseled on importance of diet and avoiding carbs, especially if knee pain is bothering him while trying to exercise. Check CBC, CMP, lipid panel.  Prediabetes: Check A1C  Eczema: Refilled clobetasol cream  External hemorrhoid: Not painful, but bothersome. Try miralax.  Health maintenance: Check HIV and PSA today (FH of prostate cancer)  Follow up in 6-12 months if not diabetic.    No orders of the defined types were  placed in this encounter.   Discussed warning signs or symptoms. Please see discharge instructions. Patient expresses understanding.

## 2016-07-07 ENCOUNTER — Ambulatory Visit: Payer: Managed Care, Other (non HMO) | Admitting: Family Medicine

## 2016-07-09 ENCOUNTER — Other Ambulatory Visit: Payer: Self-pay

## 2016-07-09 DIAGNOSIS — K2100 Gastro-esophageal reflux disease with esophagitis, without bleeding: Secondary | ICD-10-CM

## 2016-07-09 DIAGNOSIS — I10 Essential (primary) hypertension: Secondary | ICD-10-CM

## 2016-07-09 MED ORDER — HYDROCHLOROTHIAZIDE 25 MG PO TABS: ORAL_TABLET | ORAL | 1 refills | Status: DC

## 2016-07-09 MED ORDER — OMEPRAZOLE 40 MG PO CPDR: 40.0000 mg | DELAYED_RELEASE_CAPSULE | Freq: Every day | ORAL | 1 refills | Status: DC

## 2017-01-04 ENCOUNTER — Other Ambulatory Visit: Payer: Self-pay | Admitting: Family Medicine

## 2017-01-04 DIAGNOSIS — K2100 Gastro-esophageal reflux disease with esophagitis, without bleeding: Secondary | ICD-10-CM

## 2017-01-04 DIAGNOSIS — I10 Essential (primary) hypertension: Secondary | ICD-10-CM

## 2017-02-05 ENCOUNTER — Other Ambulatory Visit: Payer: Self-pay | Admitting: Family Medicine

## 2017-02-05 DIAGNOSIS — I10 Essential (primary) hypertension: Secondary | ICD-10-CM

## 2017-02-14 ENCOUNTER — Other Ambulatory Visit: Payer: Self-pay | Admitting: Family Medicine

## 2017-02-18 ENCOUNTER — Other Ambulatory Visit: Payer: Self-pay | Admitting: Family Medicine

## 2017-02-18 DIAGNOSIS — I10 Essential (primary) hypertension: Secondary | ICD-10-CM

## 2017-04-30 ENCOUNTER — Encounter: Payer: Self-pay | Admitting: Family Medicine

## 2017-04-30 ENCOUNTER — Ambulatory Visit (INDEPENDENT_AMBULATORY_CARE_PROVIDER_SITE_OTHER): Payer: Managed Care, Other (non HMO) | Admitting: Family Medicine

## 2017-04-30 VITALS — BP 150/100 | HR 69 | Ht 68.0 in | Wt 242.0 lb

## 2017-04-30 DIAGNOSIS — Z23 Encounter for immunization: Secondary | ICD-10-CM

## 2017-04-30 DIAGNOSIS — R7303 Prediabetes: Secondary | ICD-10-CM

## 2017-04-30 DIAGNOSIS — K2100 Gastro-esophageal reflux disease with esophagitis, without bleeding: Secondary | ICD-10-CM

## 2017-04-30 DIAGNOSIS — M1731 Unilateral post-traumatic osteoarthritis, right knee: Secondary | ICD-10-CM | POA: Insufficient documentation

## 2017-04-30 DIAGNOSIS — M25561 Pain in right knee: Secondary | ICD-10-CM

## 2017-04-30 DIAGNOSIS — E785 Hyperlipidemia, unspecified: Secondary | ICD-10-CM

## 2017-04-30 DIAGNOSIS — Z8042 Family history of malignant neoplasm of prostate: Secondary | ICD-10-CM

## 2017-04-30 DIAGNOSIS — Z114 Encounter for screening for human immunodeficiency virus [HIV]: Secondary | ICD-10-CM | POA: Diagnosis not present

## 2017-04-30 DIAGNOSIS — E781 Pure hyperglyceridemia: Secondary | ICD-10-CM | POA: Diagnosis not present

## 2017-04-30 DIAGNOSIS — I10 Essential (primary) hypertension: Secondary | ICD-10-CM

## 2017-04-30 DIAGNOSIS — R748 Abnormal levels of other serum enzymes: Secondary | ICD-10-CM | POA: Diagnosis not present

## 2017-04-30 DIAGNOSIS — L409 Psoriasis, unspecified: Secondary | ICD-10-CM | POA: Diagnosis not present

## 2017-04-30 LAB — POCT GLYCOSYLATED HEMOGLOBIN (HGB A1C): HEMOGLOBIN A1C: 5.7

## 2017-04-30 MED ORDER — CLOBETASOL PROPIONATE 0.05 % EX CREA: 1.0000 "application " | TOPICAL_CREAM | Freq: Two times a day (BID) | CUTANEOUS | 12 refills | Status: DC

## 2017-04-30 MED ORDER — OSELTAMIVIR PHOSPHATE 75 MG PO CAPS
75.0000 mg | ORAL_CAPSULE | Freq: Every day | ORAL | 0 refills | Status: DC
Start: 2017-04-30 — End: 2017-05-29

## 2017-04-30 MED ORDER — HYDROCHLOROTHIAZIDE 25 MG PO TABS: 25.0000 mg | ORAL_TABLET | Freq: Every day | ORAL | 0 refills | Status: DC

## 2017-04-30 MED ORDER — TRIAMCINOLONE ACETONIDE 0.1 % EX CREA: 1.0000 "application " | TOPICAL_CREAM | Freq: Two times a day (BID) | CUTANEOUS | 12 refills | Status: DC

## 2017-04-30 MED ORDER — DICLOFENAC SODIUM 1 % TD GEL: 4.0000 g | Freq: Four times a day (QID) | TRANSDERMAL | 11 refills | Status: DC

## 2017-04-30 MED ORDER — TRAZODONE HCL 50 MG PO TABS: 25.0000 mg | ORAL_TABLET | Freq: Every evening | ORAL | 3 refills | Status: DC | PRN

## 2017-04-30 MED ORDER — OMEPRAZOLE 40 MG PO CPDR: 40.0000 mg | DELAYED_RELEASE_CAPSULE | Freq: Every day | ORAL | 1 refills | Status: DC

## 2017-04-30 NOTE — Progress Notes (Signed)
Jerry CasterRobert Miller is a 52 y.o. male who [facility] [place] today for hypertension, morbid obesity, shiftwork sleep disorder, rash, prediabetes, right knee pain.  Jerry Miller was last seen November 2017.  Since then he was somewhat lost to follow-up.  He ran out of his hydrochlorothiazide that he takes for hypertension.  He notes that when he takes his medication his blood pressure seems to be pretty well controlled.  He tolerates it well with no chest pain palpitations lightheadedness or dizziness.  He would like to restart hydrochlorothiazide if possible.  Morbid obesity: Jerry Miller has struggled losing weight recently.  He is not eating a careful diet nor is he getting much dedicated exercise.  He notes that he is quite busy with his job and notes knee pain interfering with his ability to exercise  Shiftwork sleep disorder: Jerry Miller notes that he works third shift and will occasionally have difficulty sleeping during the middle of the day.  In the past he has been prescribed hydroxyzine which he finds intolerable due to the next day fatigue.  He has addressed sleep hygiene with blackout curtains and sound machines.  He estimates that he will he would benefit from a medication to help him sleep a few days a month.  He would like medications that are unlikely to cause next day fatigue.  Rash: Jerry Miller notes a rash on his torso.  In the past this was diagnosed as nummular eczema.  A skin biopsy was obtained in 2017 by his previous provider which indicated psoriasiform changes.  He notes the rash is typically well controlled with clobetasol.  He ran out of clobetasol and the rash is returned and is itchy.    Additionally Jerry Miller was found to have prediabetes on previous lab check.  He notes he has a family history of diabetes and is trying to eat fewer carbohydrates.  He would like A1c rechecked  today if possible.  Jerry Miller notes right knee pain.  This is been ongoing off and on for years.  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  He notes the knee pain is associate with soreness typically during the following activity.  He uses over-the-counter Aleve frequently especially at work which does help relieve his pain.  He denies significant locking or catching.  Possible sleep apnea: When asked Jerry Miller notes that he snores but has not been told that he stops breathing at night.  He is never been diagnosed with sleep apnea.  Past Medical History:  Diagnosis Date  . Essential hypertension 10/10/2015  . Family history of prostate cancer 06/07/2012  . Fatigue   . Hyperlipidemia LDL goal <160 06/15/2012   [place] 2% April 2014   . Kidney calculus 05/08/2011  . Obesity (BMI 30.0-34.9)   . Psoriasis 05/01/2017  . 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 KIDNEY STONE SURGERY    . KNEE ARTHROSCOPY W/ ACL RECONSTRUCTION AND PATELLA GRAFT     Social History   Tobacco Use  . Smoking status: Never Smoker  . Smokeless tobacco: Never Used  Substance Use Topics  . Alcohol use: Not on file   family history includes Diabetes in his father and mother; Heart failure in his father; Nephrolithiasis in his sister.  ROS as above:  Medications: Current Outpatient Medications  Medication Sig Dispense Refill  . clobetasol cream (TEMOVATE)  0.05 % Apply 1 application topically 2 (two) times daily. Use until rash resolves. 60 g 12  . hydrochlorothiazide (HYDRODIURIL) 25 MG tablet Take 1 tablet (25 mg total) by mouth daily. 90 tablet 0  . Omega-3 Fatty Acids (FISH OIL) 1000 MG CAPS One PO BID with meals  0  . omeprazole (PRILOSEC) 40 MG capsule Take 1 capsule (40 mg total) by mouth daily. 90 capsule 1  . diclofenac sodium (VOLTAREN) 1 % GEL Apply 4 g topically 4 (four) times daily. To affected  joint. 100 g 11  . oseltamivir (TAMIFLU) 75 MG capsule Take 1 capsule (75 mg total) by mouth daily. 10 capsule 0  . traZODone (DESYREL) 50 MG tablet Take 0.5-1 tablets (25-50 mg total) by mouth at bedtime as needed for sleep. 30 tablet 3  . triamcinolone cream (KENALOG) 0.1 % Apply 1 application topically 2 (two) times daily. 453.6 g 12   No current facility-administered medications for this visit.    No Known Allergies  Health Maintenance Health Maintenance  Topic Date Due  . HIV Screening  01/24/1981  . TETANUS/TDAP  05/07/2021  . INFLUENZA VACCINE  Completed     Exam:  BP (!) 150/100   Pulse 69   Ht 5\' 8"  (1.727 m)   Wt 242 lb (109.8 kg)   BMI 36.80 kg/m  Gen: Well NAD obese HEENT: EOMI,  MMM Lungs: Normal work of breathing. CTABL Heart: RRR no MRG Abd: NABS, Soft. Nondistended, Nontender Exts: Brisk capillary refill, warm and well perfused.  Right knee: No erythema or effusion. Range of motion 0-120 degrees with retropatellar crepitations. Nontender. Stable ligamentous exam. Intact flexion and extension strength. Skin: Erythematous plaques with some scale present as ovoid patches several centimeters in diameter on trunk.  No extensor surface changes on extremities  STOP BANG: Snore:     Yes Tired:     Yes Observed stop breathing:  No Hypertension:   Yes  BMI >35:   Yes Age >50:   No Neck > 16 inches:  Yes Male gender:   Yes ------------------------------------------ Total:     6/8   Results for orders placed or performed in visit on 04/30/17 (from the past 72 hour(s))  POCT HgB A1C     Status: None   Collection Time: 04/30/17  4:31 PM  Result Value Ref Range   Hemoglobin A1C 5.7    No results found.    Assessment and Plan: 52 y.o. male with  Hypertension: Blood pressure not at goal today.  Plan to restart hydrochlorothiazide and recheck with a nurse visit in 1 month.  Obtain basic fasting labs listed below.  Obesity: [name] recognizes this is a  medical problem and will try to work on improved diet.  Plan to recheck this in the near future.  Check basic fasting labs listed below  Prediabetes: Improved today compared to baseline.  Work on reduced carbohydrates in diet as noted above.  Shiftwork sleep disorder: After discussion plan for trial of trazodone.  Skin changes: Stability looks more consistent in appearance with psoriasis than nummular dermatitis.  However after discussion Sebert would like a referral to dermatology.  In the meantime will continue treatment with clobetasol and baseline triamcinolone cream.  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  Plan for trial of diclofenac weight loss and quad strengthening.  If not improved next steps would be x-ray versus steroid injection.  Possible sleep apnea: Patient is obese has not controlled hypertension as well as snoring.  Stop bang  6/8.  Patient will think about sleep study.  Additionally will check basic labs listed below administer flu vaccine and screen for prostate cancer with a given family history of prostate cancer.  We will also screen for HIV per CDC guidelines.  Orders Placed This Encounter  Procedures  . Flu Vaccine QUAD 36+ mos IM    [facility]  . CBC  . COMPLETE METABOLIC PANEL WITH GFR  . Lipid Panel w/reflex Direct LDL  . PSA  . HIV antibody  . Ambulatory referral to Dermatology    Referral Priority:   Routine    Referral Type:   Consultation    Referral Reason:   Specialty Services Required    Requested Specialty:   Dermatology    Number of Visits Requested:   1  . POCT HgB A1C   Meds ordered this encounter  Medications  . clobetasol cream (TEMOVATE) 0.05 %    Sig: Apply 1 application topically 2 (two) times daily. Use until rash resolves.    Dispense:  60 g    Refill:  12  . hydrochlorothiazide (HYDRODIURIL) 25 MG tablet    Sig: Take 1 tablet (25 mg total) by mouth daily.    Dispense:  90 tablet    Refill:  0  . omeprazole (PRILOSEC) 40 MG  capsule    Sig: Take 1 capsule (40 mg total) by mouth daily.    Dispense:  90 capsule    Refill:  1  . triamcinolone cream (KENALOG) 0.1 %    Sig: Apply 1 application topically 2 (two) times daily.    Dispense:  453.6 g    Refill:  12  . traZODone (DESYREL) 50 MG tablet    Sig: Take 0.5-1 tablets (25-50 mg total) by mouth at bedtime as needed for sleep.    Dispense:  30 tablet    Refill:  3  . oseltamivir (TAMIFLU) 75 MG capsule    Sig: Take 1 capsule (75 mg total) by mouth daily.    Dispense:  10 capsule    Refill:  0  . diclofenac sodium (VOLTAREN) 1 % GEL    Sig: Apply 4 g topically 4 (four) times daily. To affected joint.    Dispense:  100 g    Refill:  11     Discussed warning signs or symptoms. Please see discharge instructions. Patient expresses understanding.

## 2017-04-30 NOTE — Patient Instructions (Signed)
Thank you for coming in today. Restart medicine.  Re-use cream;  You should hear from dermatology.  Consider sleep apnea.  Get fasting labs soon.  Recheck nurse visit for blood pressure in about 1 month.  At 52 we will start thinking about colon cancer screening.  Research cologuard or colonoscopy  Recheck every 6-12 months with me. Return sooner if needed.

## 2017-05-01 ENCOUNTER — Encounter: Payer: Self-pay | Admitting: Family Medicine

## 2017-05-01 DIAGNOSIS — L409 Psoriasis, unspecified: Secondary | ICD-10-CM | POA: Insufficient documentation

## 2017-05-01 HISTORY — DX: Psoriasis, unspecified: L40.9

## 2017-05-09 LAB — COMPLETE METABOLIC PANEL WITH GFR
AG Ratio: 1.5 (calc) (ref 1.0–2.5)
ALBUMIN MSPROF: 4.4 g/dL (ref 3.6–5.1)
ALT: 43 U/L (ref 9–46)
AST: 21 U/L (ref 10–40)
Alkaline phosphatase (APISO): 64 U/L (ref 40–115)
BUN: 20 mg/dL (ref 7–25)
CALCIUM: 9.7 mg/dL (ref 8.6–10.3)
CO2: 29 mmol/L (ref 20–32)
Chloride: 103 mmol/L (ref 98–110)
Creat: 1.02 mg/dL (ref 0.60–1.35)
GFR, EST AFRICAN AMERICAN: 100 mL/min/{1.73_m2} (ref >=60)
GFR, EST NON AFRICAN AMERICAN: 86 mL/min/{1.73_m2} (ref >=60)
GLUCOSE: 109 mg/dL — AB (ref 65–99)
Globulin: 2.9 g/dL (calc) (ref 1.9–3.7)
Potassium: 3.9 mmol/L (ref 3.5–5.3)
Sodium: 141 mmol/L (ref 135–146)
TOTAL PROTEIN: 7.3 g/dL (ref 6.1–8.1)
Total Bilirubin: 0.4 mg/dL (ref 0.2–1.2)

## 2017-05-09 LAB — HIV ANTIBODY (ROUTINE TESTING W REFLEX): HIV 1&2 Ab, 4th Generation: NONREACTIVE

## 2017-05-09 LAB — LIPID PANEL W/REFLEX DIRECT LDL
CHOL/HDL RATIO: 4.7 (calc) (ref <5.0)
Cholesterol: 175 mg/dL (ref <200)
HDL: 37 mg/dL — ABNORMAL LOW (ref >40)
LDL CHOLESTEROL (CALC): 109 mg/dL — AB
NON-HDL CHOLESTEROL (CALC): 138 mg/dL — AB (ref <130)
TRIGLYCERIDES: 163 mg/dL — AB (ref <150)

## 2017-05-09 LAB — CBC
HCT: 43 % (ref 38.5–50.0)
HEMOGLOBIN: 15.1 g/dL (ref 13.2–17.1)
MCH: 30.7 pg (ref 27.0–33.0)
MCHC: 35.1 g/dL (ref 32.0–36.0)
MCV: 87.4 fL (ref 80.0–100.0)
MPV: 11.5 fL (ref 7.5–12.5)
PLATELETS: 295 10*3/uL (ref 140–400)
RBC: 4.92 10*6/uL (ref 4.20–5.80)
RDW: 12.6 % (ref 11.0–15.0)
WBC: 8.9 10*3/uL (ref 3.8–10.8)

## 2017-05-09 LAB — PSA: PSA: 0.7 ng/mL (ref <=4.0)

## 2017-05-29 ENCOUNTER — Telehealth: Payer: Self-pay | Admitting: Family Medicine

## 2017-05-29 ENCOUNTER — Ambulatory Visit (INDEPENDENT_AMBULATORY_CARE_PROVIDER_SITE_OTHER): Payer: Managed Care, Other (non HMO) | Admitting: Family Medicine

## 2017-05-29 VITALS — BP 132/89 | HR 65 | Temp 98.1°F | Resp 16 | Wt 235.0 lb

## 2017-05-29 DIAGNOSIS — I10 Essential (primary) hypertension: Secondary | ICD-10-CM

## 2017-05-29 NOTE — Telephone Encounter (Signed)
Left message on patient vm with advise as noted below. Frady Taddeo,CMA  

## 2017-05-29 NOTE — Progress Notes (Signed)
HPI: Patient is here for a blood pressure check. Patient denies chest pains, palpitations, shortness of breath, blurred vision, dizziness or medication problems.   Assessment and Plan: Initial blood reading was elevated but second was within normal limits. Advised patient he will receive a call from our office once chart has been reviewed by the provider if another nurse is needed.

## 2017-05-29 NOTE — Telephone Encounter (Signed)
Blood pressure looked good today at nurse visit.  Keep taking the HCTZ

## 2017-06-04 ENCOUNTER — Ambulatory Visit (INDEPENDENT_AMBULATORY_CARE_PROVIDER_SITE_OTHER): Payer: Managed Care, Other (non HMO) | Admitting: Family Medicine

## 2017-06-04 ENCOUNTER — Encounter: Payer: Self-pay | Admitting: Family Medicine

## 2017-06-04 VITALS — BP 132/87 | HR 91 | Ht 68.0 in | Wt 235.0 lb

## 2017-06-04 DIAGNOSIS — G4726 Circadian rhythm sleep disorder, shift work type: Secondary | ICD-10-CM | POA: Diagnosis not present

## 2017-06-04 DIAGNOSIS — M25561 Pain in right knee: Secondary | ICD-10-CM | POA: Diagnosis not present

## 2017-06-04 NOTE — Progress Notes (Signed)
Salem CasterRobert Sage is a 52 y.o. male who [facility] [place] [facility] today for right knee pain.   [name] was seen in February for right knee pain due to DJD. He was given Voltaren gel which he has been using occasionally. He notes that it helps. [place] and notes that his pain will be worse with all the walking. He would like to have a steroid injection today to help reduce his pain.   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 He is considering medication for this if possible. A co-worker uses Phenetamine which helps. He has never taken medicine like that before.   Obesity: [name] continues to have problems losing weight. He works third shift and has disordered eating.  He does not track his calories.   Past Medical History:  Diagnosis Date  . Essential hypertension 10/10/2015  . Family history of prostate cancer 06/07/2012  . Fatigue   . Hyperlipidemia LDL goal <160 06/15/2012   [place] 2% April 2014   . Kidney calculus 05/08/2011  . Obesity (BMI 30.0-34.9)   . Psoriasis 05/01/2017  . 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 KIDNEY STONE SURGERY    . KNEE ARTHROSCOPY W/ ACL RECONSTRUCTION AND PATELLA GRAFT     Social History   Tobacco Use  . Smoking status: Never Smoker  . Smokeless tobacco: Never Used  Substance Use Topics  . Alcohol use: Not on file   family history includes Diabetes in his father and mother; Heart failure in his father; Nephrolithiasis in his sister.  ROS as above:  Medications: Current Outpatient Medications  Medication Sig Dispense Refill  . clobetasol cream (TEMOVATE) 0.05 % Apply 1 application topically 2 (two) times daily. Use until rash resolves. 60 g 12  . diclofenac sodium (VOLTAREN) 1 % GEL Apply 4 g topically 4  (four) times daily. To affected joint. 100 g 11  . hydrochlorothiazide (HYDRODIURIL) 25 MG tablet Take 1 tablet (25 mg total) by mouth daily. 90 tablet 0  . Omega-3 Fatty Acids (FISH OIL) 1000 MG CAPS One PO BID with meals  0  . omeprazole (PRILOSEC) 40 MG capsule Take 1 capsule (40 mg total) by mouth daily. 90 capsule 1  . traZODone (DESYREL) 50 MG tablet Take 0.5-1 tablets (25-50 mg total) by mouth at bedtime as needed for sleep. 30 tablet 3  . triamcinolone cream (KENALOG) 0.1 % Apply 1 application topically 2 (two) times daily. 453.6 g 12   No current facility-administered medications for this visit.    No Known Allergies  Health Maintenance Health Maintenance  Topic Date Due  . INFLUENZA VACCINE  10/01/2017  . TETANUS/TDAP  05/07/2021  . HIV Screening  Completed     Exam:  BP 132/87   Pulse 91   Ht 5\' 8"  (1.727 m)   Wt 235 lb (106.6 kg)   BMI 35.73 kg/m   Wt Readings from Last 5 Encounters:  06/04/17 235 lb (106.6 kg)  05/29/17 235 lb 0.6 oz (106.6 kg)  04/30/17 242 lb (109.8 kg)  01/28/16 237 lb (107.5 kg)  10/31/15 229 lb (103.9 kg)     Gen: Well NAD HEENT: EOMI,  MMM Lungs: Normal work of breathing. CTABL Heart: RRR no MRG Abd: NABS, Soft. Nondistended, Nontender Exts: Brisk capillary refill, warm and well  perfused.  Right knee: Well-appearing midline scar overlying patella with no surrounding erythema or induration.  Minimal knee effusion present. Range of motion 0-120 degrees with 1+ retropatellar crepitations Stable ligamentous exam. Intact flexion and extension strength.  Procedure: Real-time Ultrasound Guided Injection of right knee Device: GE Logiq E   Images permanently stored and available for review in the ultrasound unit. Verbal informed consent obtained.  Discussed risks and benefits of procedure. Warned about infection bleeding damage to structures skin hypopigmentation and fat atrophy among others. Patient expresses understanding and  agreement Time-out conducted.   Noted no overlying erythema, induration, or other signs of local infection.   Skin prepped in a sterile fashion.   Local anesthesia: Topical Ethyl chloride.   With sterile technique and under real time ultrasound guidance:  80 mg of Kenalog and 4 mL of Marcaine injected easily.   Completed without difficulty   Pain immediately resolved suggesting accurate placement of the medication.   Advised to call if fevers/chills, erythema, induration, drainage, or persistent bleeding.   Images permanently stored and available for review in the ultrasound unit.  Impression: Technically successful ultrasound guided injection.      Assessment and Plan: 52 y.o. male with right knee pain due to DJD.  Status post injection today.  Continue diclofenac gel and quadricep strengthening.  Emphasized weight loss as well.  Recheck as needed.  Shiftwork sleep disorder.  I do not think phentermine is the best option for shiftwork sleep disorder.  We discussed Provigil or Nuvigil.  Patient will do some research and let me know.  Handout provided.   Morbid obesity: BMI greater than 35.  Cofactors are hypertension hyperlipidemia prediabetes knee arthritis. We discussed weight loss strategies.  Plan for food log and meal prep.  Recommend reduced carbohydrate diet.  Additionally discussed Qsymia which may have some benefit for Kalum in the long run.  Handout provided.  Catalino is thinking about it.   No orders of the defined types were placed in this encounter.  No orders of the defined types were placed in this encounter.    Discussed warning signs or symptoms. Please see discharge instructions. Patient expresses understanding.

## 2017-06-04 NOTE — Patient Instructions (Signed)
Thank you for coming in today. Call or go to the ER if you develop a large red swollen joint with extreme pain or oozing puss.  Work on weight loss by paying attention to calories.  Return sooner if needed.   Research Provigil and Nuvigil.   For weight loss consider Qsymia .    Modafinil tablets What is this medicine? MODAFINIL (moe DAF i nil) is used to treat excessive sleepiness caused by certain sleep disorders. This includes narcolepsy, sleep apnea, and shift work sleep disorder. This medicine may be used for other purposes; ask your health care provider or pharmacist if you have questions. COMMON BRAND NAME(S): Provigil What should I tell my health care provider before I take this medicine? They need to know if you have any of these conditions: -history of depression, mania, or other mental disorder -kidney disease -liver disease -an unusual or allergic reaction to modafinil, other medicines, foods, dyes, or preservatives -pregnant or trying to get pregnant -breast-feeding How should I use this medicine? Take this medicine by mouth with a glass of water. Follow the directions on the prescription label. Take your doses at regular intervals. Do not take your medicine more often than directed. Do not stop taking except on your doctor's advice. A special MedGuide will be given to you by the pharmacist with each prescription and refill. Be sure to read this information carefully each time. Talk to your pediatrician regarding the use of this medicine in children. This medicine is not approved for use in children. Overdosage: If you think you have taken too much of this medicine contact a poison control center or emergency room at once. NOTE: This medicine is only for you. Do not share this medicine with others. What if I miss a dose? If you miss a dose, take it as soon as you can. If it is almost time for your next dose, take only that dose. Do not take double or extra doses. What may  interact with this medicine? Do not take this medicine with any of the following medications: -amphetamine or dextroamphetamine -dexmethylphenidate or methylphenidate -medicines called MAO Inhibitors like Nardil, Parnate, Marplan, Eldepryl -pemoline -procarbazine This medicine may also interact with the following medications: -antifungal medicines like itraconazole or ketoconazole -barbiturates like phenobarbital -birth control pills or other hormone-containing birth control devices or implants -carbamazepine -cyclosporine -diazepam -medicines for depression, anxiety, or psychotic disturbances -phenytoin -propranolol -triazolam -warfarin This list may not describe all possible interactions. Give your health care provider a list of all the medicines, herbs, non-prescription drugs, or dietary supplements you use. Also tell them if you smoke, drink alcohol, or use illegal drugs. Some items may interact with your medicine. What should I watch for while using this medicine? Visit your doctor or health care professional for regular checks on your progress. The full effects of this medicine may not be seen right away. This medicine may affect your concentration, function, or may hide signs that you are tired. You may get dizzy. Do not drive, use machinery, or do anything that needs mental alertness until you know how this drug affects you. Alcohol can make you more dizzy and may interfere with your response to this medicine or your alertness. Avoid alcoholic drinks. Birth control pills may not work properly while you are taking this medicine. Talk to your doctor about using an extra method of birth control. It is unknown if the effects of this medicine will be increased by the use of caffeine. Caffeine is available in  many foods, beverages, and medications. Ask your doctor if you should limit or change your intake of caffeine-containing products while on this medicine. What side effects may I  notice from receiving this medicine? Side effects that you should report to your doctor or health care professional as soon as possible: -allergic reactions like skin rash, itching or hives, swelling of the face, lips, or tongue -anxiety -breathing problems -chest pain -fast, irregular heartbeat -hallucinations -increased blood pressure -redness, blistering, peeling or loosening of the skin, including inside the mouth -sore throat, fever, or chills -suicidal thoughts or other mood changes -tremors -vomiting Side effects that usually do not require medical attention (report to your doctor or health care professional if they continue or are bothersome): -headache -nausea, diarrhea, or stomach upset -nervousness -trouble sleeping This list may not describe all possible side effects. Call your doctor for medical advice about side effects. You may report side effects to FDA at 1-800-FDA-1088. Where should I keep my medicine? Keep out of the reach of children. This medicine can be abused. Keep your medicine in a safe place to protect it from theft. Do not share this medicine with anyone. Selling or giving away this medicine is dangerous and against the law. This medicine may cause accidental overdose and death if taken by other adults, children, or pets. Mix any unused medicine with a substance like cat litter or coffee grounds. Then throw the medicine away in a sealed container like a sealed bag or a coffee can with a lid. Do not use the medicine after the expiration date. Store at room temperature between 20 and 25 degrees C (68 and 77 degrees F). NOTE: This sheet is a summary. It may not cover all possible information. If you have questions about this medicine, talk to your doctor, pharmacist, or health care provider.  2018 Elsevier/Gold Standard (2013-11-08 15:34:55)   Phentermine; Topiramate extended-release capsules What is this medicine? Phentermine; topiramate (FEN ter meen; toe PYRE  a mate) is a combination of two medicines used with a reduced calorie diet and exercise to help you lose weight. This medicine is only available through certified pharmacies enrolled in a special program. Your healthcare professional will tell you where you can get your medicine. If you have additional questions, you can visit the manufacture's website at [email or web address] or contact them by phone at [phone]. This medicine may be used for other purposes; ask your health care provider or pharmacist if you have questions. COMMON BRAND NAME(S): Qsymia What should I tell my health care provider before I take this medicine? They need to know if you have any of these conditions: -agitation -diarrhea -depression or other mental illness -diabetes -glaucoma -heart disease -high or low blood pressure -history of anorexia or other eating disorder -history of substance abuse -kidney stones or kidney disease -liver disease -lung disease like asthma, obstructive pulmonary disease, emphysema -metabolic acidosis -on a ketogenic diet -scheduled for surgery or a procedure -suicidal thoughts, plans, or attempt; a previous suicide attempt by you or a family member -taken an MAOI like Carbex, Eldepryl, Marplan, Nardil, or Parnate in last 14 days -thyroid disease -an unusual or allergic reaction to phentermine, topiramate, other medicines, foods, dyes, or preservatives -pregnant or trying to get pregnant -breast-feeding How should I use this medicine? Take this medicine by mouth with a glass of water. Follow the directions on the prescription label. Do not crush or chew. This medicine is usually taken with or without food once per day in the morning. Avoid  taking this medicine in the evening. It may interfere with sleep. Take your doses at regular intervals. Do not take your medicine more often than directed. A special MedGuide will be given to you by the pharmacist with each prescription and refill.  Be sure to read this information carefully each time. Talk to your pediatrician regarding the use of this medicine in children. Special care may be needed. Overdosage: If you think you have taken too much of this medicine contact a poison control center or emergency room at once. NOTE: This medicine is only for you. Do not share this medicine with others. What if I miss a dose? If you miss a dose, take it as soon as you can. If it is almost time for your next dose, take only that dose. Do not take double or extra doses. What may interact with this medicine? Do not take this medicine with any of the following medications: -MAOIs like Carbex, Eldepryl, Marplan, Nardil, and Parnate This medicine may also interact with the following medications: -acetazolamide -amitriptyline -antihistamines for allergy, cough and cold -atropine -birth control pills -carbamazepine -certain medicines for bladder problems like oxybutynin, tolterodine -certain medicines for depression, anxiety, or psychotic disturbances -certain medicines for Parkinson's disease like benztropine, trihexyphenidyl -certain medicines for stomach problems like dicyclomine, hyoscyamine -certain medicines for travel sickness like scopolamine -dichlorphenamide -digoxin -diltiazem -diuretics -hydrochlorothiazide -ipratropium -lithium -medicines for diabetes -medicines for pain, sleep, or muscle relaxation -methazolamide -phenytoin -pioglitazone -stimulant medicines for attention disorders, weight loss, or to stay awake -valproic acid -zonisamide This list may not describe all possible interactions. Give your health care provider a list of all the medicines, herbs, non-prescription drugs, or dietary supplements you use. Also tell them if you smoke, drink alcohol, or use illegal drugs. Some items may interact with your medicine. What should I watch for while using this medicine? Visit your doctor or health care professional for  regular checks on your progress. This medicine is intended to be used in addition to a healthy diet and appropriate exercise. The best results are achieved this way. Do not increase or in any way change your dose without consulting your doctor or health care professional. Do not take this medicine within 6 hours of bedtime. It can keep you from getting to sleep. Avoid drinks that contain caffeine and try to stick to a regular bedtime every night. Do not stop taking this medicine suddenly. This increases the risk of seizures. This medicine can decrease sweating and increase your body temperature. Watch for signs of deceased sweating or fever. Avoid extreme heat, hot baths, and saunas. Be careful about exercising, especially in hot weather. Contact your health care provider right away if you notice a fever or decrease in sweating. You should drink plenty of fluids while taking this medicine. If you have had kidney stones in the past, this will help to reduce your chances of forming kidney stones. If you have stomach pain, with nausea or vomiting and yellowing of your eyes or skin, call your doctor immediately. You may get drowsy or dizzy. Do not drive, use machinery, or do anything that needs mental alertness until you know how this medicine affects you. Do not stand or sit up quickly, especially if you are an older patient. This reduces the risk of dizzy or fainting spells. Alcohol may increase dizziness and drowsiness. Avoid alcoholic drinks. This medicine may affect blood sugar levels. If you have diabetes, check with your doctor or health care professional before you change your diet  or the dose of your diabetic medicine. Patients and their families should watch out for worsening depression or thoughts of suicide. Also watch out for sudden changes in feelings such as feeling anxious, agitated, panicky, irritable, hostile, aggressive, impulsive, severely restless, overly excited and hyperactive, or not  being able to sleep. If this happens, especially at the beginning of treatment or after a change in dose, call your health care professional. If you notice blurred vision, eye pain, or other eye problems, seek medical attention at once for an eye exam. This medicine may increase the chance of developing metabolic acidosis. If left untreated, this can cause kidney stones, bone disease, or slowed growth in children. Symptoms include breathing fast, fatigue, loss of appetite, irregular heartbeat, or loss of consciousness. Call your doctor immediately if you experience any of these side effects. Also, tell your doctor about any surgery you plan on having while taking this medicine since this may increase your risk for metabolic acidosis. Women who become pregnant while using this medicine should contact their physician immediately. You should also contact The Qsymia Pregnancy Surveillance Program which is a program that monitors pregnancies that occur during treatment. Contact the program by calling [phone]. What side effects may I notice from receiving this medicine? Side effects that you should report to your doctor or health care professional as soon as possible: -allergic reactions like skin rash, itching or hives, swelling of the face, lips, or tongue -blood in the urine -changes in vision -chest pain or chest tightness -confusion -depressed mood -difficulty breathing -dizziness -fast or irregular heartbeat -feeling anxious -irritable -loss of appetite -low blood pressure -pain in the lower back or side -pain, tingling, numbness in the hands or feet -pain when urinating -palpitations -redness, blistering, peeling or loosening of the skin, including inside the mouth -shortness of breath -suicidal thoughts or other mood changes -trouble passing urine or change in the amount of urine -trouble walking, dizziness, loss of balance or coordination -unusually weak or  tired -vomiting Side effects that usually do not require medical attention (report to your doctor or health care professional if they continue or are bothersome): -change in sex drive or performance -changes in vision -constipation -diarrhea -dry mouth -headache -nausea -tremors -trouble sleeping -upset stomach This list may not describe all possible side effects. Call your doctor for medical advice about side effects. You may report side effects to FDA at 1-800-FDA-1088. Where should I keep my medicine? Keep out of the reach of children. This medicine can be abused. Keep your medicine in a safe place to protect it from theft. Do not share this medicine with anyone. Selling or giving away this medicine is dangerous and against the law. This medicine may cause accidental overdose and death if taken by other adults, children, or pets. Mix any unused medicine with a substance like cat litter or coffee grounds. Then throw the medicine away in a sealed container like a sealed bag or a coffee can with a lid. Do not use the medicine after the expiration date. Store at room temperature between 15 and 25 degrees C (59 and 77 degrees F). NOTE: This sheet is a summary. It may not cover all possible information. If you have questions about this medicine, talk to your doctor, pharmacist, or health care provider.  2018 Elsevier/Gold Standard (2015-03-22 14:30:46)

## 2017-06-12 ENCOUNTER — Telehealth: Payer: Self-pay | Admitting: Emergency Medicine

## 2017-06-12 ENCOUNTER — Emergency Department (INDEPENDENT_AMBULATORY_CARE_PROVIDER_SITE_OTHER)
Admission: EM | Admit: 2017-06-12 | Discharge: 2017-06-12 | Disposition: A | Discharge status: Home / Self Care | Payer: Managed Care, Other (non HMO) | Source: Home / Self Care | Attending: Emergency Medicine | Admitting: Emergency Medicine

## 2017-06-12 ENCOUNTER — Other Ambulatory Visit: Payer: Self-pay

## 2017-06-12 ENCOUNTER — Encounter: Payer: Self-pay | Admitting: Emergency Medicine

## 2017-06-12 DIAGNOSIS — J029 Acute pharyngitis, unspecified: Secondary | ICD-10-CM

## 2017-06-12 LAB — POCT RAPID STREP A (OFFICE): Rapid Strep A Screen: NEGATIVE

## 2017-06-12 NOTE — ED Triage Notes (Signed)
Patient reports sore throat and cough starting yesterday; possible exposure to Strep; going out of town tomorrow. Denies fever.

## 2017-06-12 NOTE — ED Provider Notes (Signed)
[facility]    CSN: [id number] Arrival date & time: 06/12/17  0809     History   Chief Complaint Chief Complaint  Patient presents with  . Sore Throat  . Cough    HPI Jerry Miller is a 52 y.o. male.   HPI URI HISTORY  Jerry Miller is a 52 y.o. male who complains of onset of mild sore throat, mild clear postnasal drainage, minimal nonproductive cough.  He was exposed to strep and he is leaving out of town tomorrow, so he wanted to be checked for strep.  Denies fever chills nausea or vomiting or ear complaints.  No swollen neck glands. No chills/sweats No  Fever No Discolored Post-nasal drainage No sinus pain/pressure No sore throat No wheezing No chest congestion No hemoptysis No shortness of breath No pleuritic painNo itchy/red eyes No earache  No nausea No vomiting No abdominal pain No diarrhea No skin rashes + mild  Fatigue No myalgias No headache   Past Medical History:  Diagnosis Date  . Essential hypertension 10/10/2015  . Family history of prostate cancer 06/07/2012  . Fatigue   . Hyperlipidemia LDL goal <160 06/15/2012   [place] 2% April 2014   . Kidney calculus 05/08/2011  . Obesity (BMI 30.0-34.9)   . Psoriasis 05/01/2017  . 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 Psoriasis 05/01/2017  . Right knee pain 04/30/2017  . Essential hypertension 10/10/2015  . 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 Prediabetes 06/15/2012  . Elevated liver enzymes 06/15/2012  . Hypertriglyceridemia 06/15/2012  . Hyperlipidemia LDL goal <160 06/15/2012  . Family history of prostate cancer 06/07/2012  . Kidney calculus 05/08/2011  . Fatigue 05/08/2011  . Morbid obesity (HCC) 05/08/2011  . Reflux esophagitis 05/08/2011    Past Surgical History:  Procedure Laterality Date  . KIDNEY STONE SURGERY    . KNEE ARTHROSCOPY W/ ACL RECONSTRUCTION AND PATELLA GRAFT         Home Medications    Prior to  Admission medications   Medication Sig Start Date End Date Taking? Authorizing Provider  clobetasol cream (TEMOVATE) 0.05 % Apply 1 application topically 2 (two) times daily. Use until rash resolves. 04/30/17   Rodolph Bong, MD  diclofenac sodium (VOLTAREN) 1 % GEL Apply 4 g topically 4 (four) times daily. To affected joint. 04/30/17   Rodolph Bong, MD  hydrochlorothiazide (HYDRODIURIL) 25 MG tablet Take 1 tablet (25 mg total) by mouth daily. 04/30/17   Rodolph Bong, MD  Omega-3 Fatty Acids (FISH OIL) 1000 MG CAPS One PO BID with meals 06/16/14   Hommel, Sean, DO  omeprazole (PRILOSEC) 40 MG capsule Take 1 capsule (40 mg total) by mouth daily. 04/30/17   Rodolph Bong, MD  traZODone (DESYREL) 50 MG tablet Take 0.5-1 tablets (25-50 mg total) by mouth at bedtime as needed for sleep. 04/30/17   Rodolph Bong, MD  triamcinolone cream (KENALOG) 0.1 % Apply 1 application topically 2 (two) times daily. 04/30/17   Rodolph Bong, MD    Family History Family History  Problem Relation Age of Onset  . Diabetes Mother   . Diabetes Father   . Heart failure Father   . Nephrolithiasis Sister     Social History Social History   Tobacco Use  . Smoking status: Never Smoker  . Smokeless tobacco: Never Used  Substance Use Topics  . Alcohol use: Not on file  . Drug use:  Not on file     Allergies   Patient has no known allergies.   Review of Systems Review of Systems  All other systems reviewed and are negative.    Physical Exam Triage Vital Signs ED Triage Vitals  Enc Vitals Group     BP 06/12/17 0829 131/86     Pulse Rate 06/12/17 0829 75     Resp 06/12/17 0829 16     Temp 06/12/17 0829 98.6 F (37 C)     Temp Source 06/12/17 0829 Oral     SpO2 06/12/17 0829 98 %     Weight 06/12/17 0830 235 lb (106.6 kg)     Height 06/12/17 0830 5\' 8"  (1.727 m)     Head Circumference --      Peak Flow --      Pain Score 06/12/17 0830 1     Pain Loc --      Pain Edu? --      Excl. in GC? --    No  data found.  Updated Vital Signs BP 131/86 (BP Location: Right Arm)   Pulse 75   Temp 98.6 F (37 C) (Oral)   Resp 16   Ht 5\' 8"  (1.727 m)   Wt 235 lb (106.6 kg)   SpO2 98%   BMI 35.73 kg/m   Visual Acuity Right Eye Distance:   Left Eye Distance:   Bilateral Distance:    Right Eye Near:   Left Eye Near:    Bilateral Near:     Physical Exam  Constitutional: He is oriented to person, place, and time. He appears well-developed and well-nourished. He is cooperative.  Non-toxic appearance. No distress.  HENT:  Head: Normocephalic and atraumatic.  Right Ear: Tympanic membrane, external ear and ear canal normal.  Left Ear: Tympanic membrane, external ear and ear canal normal.  Nose: Nose normal. Right sinus exhibits no maxillary sinus tenderness and no frontal sinus tenderness. Left sinus exhibits no maxillary sinus tenderness and no frontal sinus tenderness.  Mouth/Throat: Mucous membranes are normal. No oral lesions. No uvula swelling. Posterior oropharyngeal erythema (minimal) present. No oropharyngeal exudate or posterior oropharyngeal edema.  Eyes: Conjunctivae are normal. No scleral icterus.  Neck: Neck supple.  Cardiovascular: Normal rate, regular rhythm and normal heart sounds.  No murmur heard. Pulmonary/Chest: Effort normal and breath sounds normal. No stridor. No respiratory distress. He has no wheezes. He has no rales.  Musculoskeletal: He exhibits no edema.  Lymphadenopathy:    He has no cervical adenopathy.       Right cervical: No deep cervical and no posterior cervical adenopathy present.      Left cervical: No deep cervical and no posterior cervical adenopathy present.  Neurological: He is alert and oriented to person, place, and time.  Skin: Skin is warm and dry.  Psychiatric: He has a normal mood and affect.  Nursing note and vitals reviewed.    UC Treatments / Results  Labs (all labs ordered are listed, but only abnormal results are displayed) Labs  Reviewed  POCT RAPID STREP A (OFFICE)    EKG None Radiology No results found.  Procedures Procedures (including critical care time)  Medications Ordered in UC Medications - No data to display   Initial Impression / Assessment and Plan / UC Course  I have reviewed the triage vital signs and the nursing notes.  Pertinent labs & imaging results that were available during my care of the patient were reviewed by me and considered in my  medical decision making (see chart for details).     RST neg. Likely viral sore throat/URI.  No evidence of bacterial infection. Discussed w pt.  Symptomatic care.  Red flags discussed, reck prn. He voiced agreement.  Final Clinical Impressions(s) / UC Diagnoses   Final diagnoses:  Sore throat     Lajean Manes, MD 06/12/17 [phone]

## 2017-07-29 ENCOUNTER — Other Ambulatory Visit: Payer: Self-pay | Admitting: Family Medicine

## 2017-07-29 DIAGNOSIS — I10 Essential (primary) hypertension: Secondary | ICD-10-CM

## 2017-08-12 ENCOUNTER — Telehealth: Payer: Self-pay | Admitting: Family Medicine

## 2017-08-12 NOTE — Telephone Encounter (Signed)
Letter received from insurance company saying that patient is not feeling hydrochlorothiazide regularly for blood pressure control.  Please contact patient and confirm that he is taking his medications regularly.  He does have trouble remembering to take his medications recommend using a pillbox.  Recheck as needed with me.

## 2017-08-13 NOTE — Telephone Encounter (Signed)
Left message on patient vm with advise as noted below. Winferd Wease,CMA  

## 2017-08-29 ENCOUNTER — Other Ambulatory Visit: Payer: Self-pay | Admitting: Family Medicine

## 2017-10-30 ENCOUNTER — Ambulatory Visit: Payer: Managed Care, Other (non HMO) | Admitting: Family Medicine

## 2017-11-16 ENCOUNTER — Other Ambulatory Visit: Payer: Self-pay

## 2017-11-16 DIAGNOSIS — I10 Essential (primary) hypertension: Secondary | ICD-10-CM

## 2017-11-16 MED ORDER — HYDROCHLOROTHIAZIDE 25 MG PO TABS: 25.0000 mg | ORAL_TABLET | Freq: Every day | ORAL | 0 refills | Status: DC

## 2017-11-23 ENCOUNTER — Encounter: Payer: Self-pay | Admitting: Family Medicine

## 2017-11-23 ENCOUNTER — Ambulatory Visit (INDEPENDENT_AMBULATORY_CARE_PROVIDER_SITE_OTHER): Payer: Managed Care, Other (non HMO) | Admitting: Family Medicine

## 2017-11-23 VITALS — BP 143/93 | HR 60 | Temp 98.0°F | Ht 68.0 in | Wt 244.0 lb

## 2017-11-23 DIAGNOSIS — R748 Abnormal levels of other serum enzymes: Secondary | ICD-10-CM

## 2017-11-23 DIAGNOSIS — E781 Pure hyperglyceridemia: Secondary | ICD-10-CM

## 2017-11-23 DIAGNOSIS — R7303 Prediabetes: Secondary | ICD-10-CM

## 2017-11-23 DIAGNOSIS — E785 Hyperlipidemia, unspecified: Secondary | ICD-10-CM

## 2017-11-23 DIAGNOSIS — Z23 Encounter for immunization: Secondary | ICD-10-CM

## 2017-11-23 DIAGNOSIS — K2100 Gastro-esophageal reflux disease with esophagitis, without bleeding: Secondary | ICD-10-CM

## 2017-11-23 DIAGNOSIS — I10 Essential (primary) hypertension: Secondary | ICD-10-CM | POA: Diagnosis not present

## 2017-11-23 MED ORDER — OMEPRAZOLE 40 MG PO CPDR: 40.0000 mg | DELAYED_RELEASE_CAPSULE | Freq: Every day | ORAL | 3 refills | Status: DC

## 2017-11-23 MED ORDER — HYDROCHLOROTHIAZIDE 25 MG PO TABS: 25.0000 mg | ORAL_TABLET | Freq: Every day | ORAL | 3 refills | Status: DC

## 2017-11-23 MED ORDER — LISINOPRIL 10 MG PO TABS
10.0000 mg | ORAL_TABLET | Freq: Every day | ORAL | 0 refills | Status: DC
Start: 2017-11-23 — End: 2018-03-08

## 2017-11-23 NOTE — Patient Instructions (Addendum)
Thank you for coming in today. I recommend getting 1900 calories a day.  Consider meal planning or meal prepping.   Get labs in about 1 month.   Start Lisinopril for blood pressure.  Continue HCTZ.  Recheck blood pressure in about 1 month and we will go over food log then and re-assess how lifestyle is going.   Lisinopril tablets What is this medicine? LISINOPRIL (lyse [place] pril) is an ACE inhibitor. This medicine is used to treat high blood pressure and heart failure. It is also used to protect the heart immediately after a heart attack. This medicine may be used for other purposes; ask your health care provider or pharmacist if you have questions. COMMON BRAND NAME(S): Prinivil, Zestril What should I tell my health care provider before I take this medicine? They need to know if you have any of these conditions: -diabetes -heart or blood vessel disease -kidney disease -low blood pressure -previous swelling of the tongue, face, or lips with difficulty breathing, difficulty swallowing, hoarseness, or tightening of the throat -an unusual or allergic reaction to lisinopril, other ACE inhibitors, insect venom, foods, dyes, or preservatives -pregnant or trying to get pregnant -breast-feeding How should I use this medicine? Take this medicine by mouth with a glass of water. Follow the directions on your prescription label. You may take this medicine with or without food. If it upsets your stomach, take it with food. Take your medicine at regular intervals. Do not take it more often than directed. Do not stop taking except on your doctor's advice. Talk to your pediatrician regarding the use of this medicine in children. Special care may be needed. While this drug may be prescribed for children as young as 466 years of age for selected conditions, precautions do apply. Overdosage: If you think you have taken too much of this medicine contact a poison control center or emergency room at  once. NOTE: This medicine is only for you. Do not share this medicine with others. What if I miss a dose? If you miss a dose, take it as soon as you can. If it is almost time for your next dose, take only that dose. Do not take double or extra doses. What may interact with this medicine? Do not take this medicine with any of the following medications: -hymenoptera venom -sacubitril; valsartan This medicines may also interact with the following medications: -aliskiren -angiotensin receptor blockers, like losartan or valsartan -certain medicines for diabetes -diuretics -everolimus -gold compounds -lithium -NSAIDs, medicines for pain and inflammation, like ibuprofen or naproxen -potassium salts or supplements -salt substitutes -sirolimus -temsirolimus This list may not describe all possible interactions. Give your health care provider a list of all the medicines, herbs, non-prescription drugs, or dietary supplements you use. Also tell them if you smoke, drink alcohol, or use illegal drugs. Some items may interact with your medicine. What should I watch for while using this medicine? Visit your doctor or health care professional for regular check ups. Check your blood pressure as directed. Ask your doctor what your blood pressure should be, and when you should contact him or her. Do not treat yourself for coughs, colds, or pain while you are using this medicine without asking your doctor or health care professional for advice. Some ingredients may increase your blood pressure. Women should inform their doctor if they wish to become pregnant or think they might be pregnant. There is a potential for serious side effects to an unborn child. Talk to your health care  professional or pharmacist for more information. Check with your doctor or health care professional if you get an attack of severe diarrhea, nausea and vomiting, or if you sweat a lot. The loss of too much body fluid can make it  dangerous for you to take this medicine. You may get drowsy or dizzy. Do not drive, use machinery, or do anything that needs mental alertness until you know how this drug affects you. Do not stand or sit up quickly, especially if you are an older patient. This reduces the risk of dizzy or fainting spells. Alcohol can make you more drowsy and dizzy. Avoid alcoholic drinks. Avoid salt substitutes unless you are told otherwise by your doctor or health care professional. What side effects may I notice from receiving this medicine? Side effects that you should report to your doctor or health care professional as soon as possible: -allergic reactions like skin rash, itching or hives, swelling of the hands, feet, face, lips, throat, or tongue -breathing problems -signs and symptoms of kidney injury like trouble passing urine or change in the amount of urine -signs and symptoms of increased potassium like muscle weakness; chest pain; or fast, irregular heartbeat -signs and symptoms of liver injury like dark yellow or brown urine; general ill feeling or flu-like symptoms; light-colored stools; loss of appetite; nausea; right upper belly pain; unusually weak or tired; yellowing of the eyes or skin -signs and symptoms of low blood pressure like dizziness; feeling faint or lightheaded, falls; unusually weak or tired -stomach pain with or without nausea and vomiting Side effects that usually do not require medical attention (report to your doctor or health care professional if they continue or are bothersome): -changes in taste -cough -dizziness -fever -headache -sensitivity to light This list may not describe all possible side effects. Call your doctor for medical advice about side effects. You may report side effects to FDA at 1-800-FDA-1088. Where should I keep my medicine? Keep out of the reach of children. Store at room temperature between 15 and 30 degrees C (59 and 86 degrees F). Protect from  moisture. Keep container tightly closed. Throw away any unused medicine after the expiration date. NOTE: This sheet is a summary. It may not cover all possible information. If you have questions about this medicine, talk to your doctor, pharmacist, or health care provider.  2018 Elsevier/Gold Standard (2015-04-09 12:52:35)

## 2017-11-23 NOTE — Progress Notes (Signed)
Jerry Miller is a 52 y.o. male who presents to [facility]: Primary Care Sports Medicine today for follow-up hypertension obesity and acid reflux.  Jerry Miller was seen about 6 months ago and found to have hypertension.  He was started on hydrochlorothiazide and tolerates it well.  No chest pain palpitations or shortness of breath.  He does not check his blood pressure regularly.  Obesity: Jerry Miller notes that he is struggled to lose weight.  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  He is trying to add exercise and eat a careful diet.  Additionally he continues to have acid reflux and takes omeprazole regularly.  He notes this helps.   ROS as above:  Exam:  BP (!) 143/93   Pulse 60   Temp 98 F (36.7 C) (Oral)   Ht 5\' 8"  (1.727 m)   Wt 244 lb (110.7 kg)   BMI 37.10 kg/m  Wt Readings from Last 5 Encounters:  11/23/17 244 lb (110.7 kg)  06/12/17 235 lb (106.6 kg)  06/04/17 235 lb (106.6 kg)  05/29/17 235 lb 0.6 oz (106.6 kg)  04/30/17 242 lb (109.8 kg)    Gen: Well NAD Acid HEENT: EOMI,  MMM Lungs: Normal work of breathing. CTABL Heart: RRR no MRG Abd: NABS, Soft. Nondistended, Nontender Exts: Brisk capillary refill, warm and well perfused.       Assessment and Plan: 52 y.o. male with  Hypertension: Blood pressure not well controlled.  Plan to add lisinopril and recheck in about a month.  Obesity: Continues to experience morbid obesity.  Discussed diet and exercise strategy.  Recheck in 1 month.  Acid reflux continue omeprazole.  Work on obesity.   Orders Placed This Encounter  Procedures  . Flu Vaccine QUAD 36+ mos IM  . COMPLETE METABOLIC PANEL WITH GFR   Meds ordered this encounter  Medications  . omeprazole (PRILOSEC) 40 MG capsule    Sig: Take 1 capsule (40 mg total) by mouth daily.    Dispense:  90 capsule    Refill:  3  .  hydrochlorothiazide (HYDRODIURIL) 25 MG tablet    Sig: Take 1 tablet (25 mg total) by mouth daily.    Dispense:  90 tablet    Refill:  3  . lisinopril (PRINIVIL,ZESTRIL) 10 MG tablet    Sig: Take 1 tablet (10 mg total) by mouth daily.    Dispense:  90 tablet    Refill:  0     Historical information moved to improve visibility of documentation.  Past Medical History:  Diagnosis Date  . Essential hypertension 10/10/2015  . Family history of prostate cancer 06/07/2012  . Fatigue   . Hyperlipidemia LDL goal <160 06/15/2012   [place] 2% April 2014   . Kidney calculus 05/08/2011  . Obesity (BMI 30.0-34.9)   . Psoriasis 05/01/2017  . 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 KIDNEY STONE SURGERY    . KNEE ARTHROSCOPY W/ ACL RECONSTRUCTION AND PATELLA GRAFT     Social History   Tobacco Use  . Smoking status: Never Smoker  . Smokeless tobacco: Never Used  Substance Use Topics  . Alcohol use: Not on file   family history includes Diabetes in his father and mother; Heart failure in his father; Nephrolithiasis in his sister.  Medications: Current Outpatient Medications  Medication Sig Dispense Refill  . clobetasol cream (TEMOVATE) 0.05 % Apply 1 application  topically 2 (two) times daily. Use until rash resolves. 60 g 12  . diclofenac sodium (VOLTAREN) 1 % GEL Apply 4 g topically 4 (four) times daily. To affected joint. 100 g 11  . hydrochlorothiazide (HYDRODIURIL) 25 MG tablet Take 1 tablet (25 mg total) by mouth daily. 90 tablet 3  . Omega-3 Fatty Acids (FISH OIL) 1000 MG CAPS One PO BID with meals  0  . omeprazole (PRILOSEC) 40 MG capsule Take 1 capsule (40 mg total) by mouth daily. 90 capsule 3  . triamcinolone cream (KENALOG) 0.1 % Apply 1 application topically 2 (two) times daily. 453.6 g 12  . lisinopril (PRINIVIL,ZESTRIL) 10 MG tablet Take 1 tablet (10 mg total) by mouth daily. 90 tablet 0   No current facility-administered  medications for this visit.    No Known Allergies   Discussed warning signs or symptoms. Please see discharge instructions. Patient expresses understanding.

## 2017-12-21 ENCOUNTER — Ambulatory Visit (INDEPENDENT_AMBULATORY_CARE_PROVIDER_SITE_OTHER): Payer: Managed Care, Other (non HMO) | Admitting: Family Medicine

## 2017-12-21 ENCOUNTER — Encounter: Payer: Self-pay | Admitting: Family Medicine

## 2017-12-21 VITALS — BP 123/74 | HR 58 | Ht 68.0 in | Wt 230.0 lb

## 2017-12-21 DIAGNOSIS — R7303 Prediabetes: Secondary | ICD-10-CM | POA: Diagnosis not present

## 2017-12-21 DIAGNOSIS — R748 Abnormal levels of other serum enzymes: Secondary | ICD-10-CM | POA: Diagnosis not present

## 2017-12-21 DIAGNOSIS — E781 Pure hyperglyceridemia: Secondary | ICD-10-CM

## 2017-12-21 DIAGNOSIS — I10 Essential (primary) hypertension: Secondary | ICD-10-CM | POA: Diagnosis not present

## 2017-12-21 DIAGNOSIS — E669 Obesity, unspecified: Secondary | ICD-10-CM

## 2017-12-21 DIAGNOSIS — E785 Hyperlipidemia, unspecified: Secondary | ICD-10-CM

## 2017-12-21 NOTE — Progress Notes (Signed)
Jerry Miller is a 52 y.o. male who presents to [facility]: Primary Care Sports Medicine today for follow-up hypertension.  Jerry Miller was seen last month and found to be not well controlled.  He was continuing to take hydrochlorothiazide.  Lisinopril was added.  He takes both medications listed below.  He tolerates them.  Additionally at the last visit he had regained some weight and in the interim has started a lower calorie diet.  He is eating approximately 1900 cal/day and is lost weight.  He feels great and is happy with how things are going.  He notes that he is probably due for some basic fasting labs as well.   ROS as above:  Exam:  BP 123/74   Pulse (!) 58   Ht 5\' 8"  (1.727 m)   Wt 230 lb (104.3 kg)   BMI 34.97 kg/m  Wt Readings from Last 15 Encounters:  12/21/17 230 lb (104.3 kg)  11/23/17 244 lb (110.7 kg)  06/12/17 235 lb (106.6 kg)  06/04/17 235 lb (106.6 kg)  05/29/17 235 lb 0.6 oz (106.6 kg)  04/30/17 242 lb (109.8 kg)  01/28/16 237 lb (107.5 kg)  10/31/15 229 lb (103.9 kg)  10/10/15 233 lb (105.7 kg)  05/13/15 230 lb (104.3 kg)  04/18/15 235 lb (106.6 kg)  04/11/15 236 lb (107 kg)  06/15/14 230 lb (104.3 kg)  05/15/14 229 lb (103.9 kg)  06/20/13 228 lb (103.4 kg)     Gen: Well NAD HEENT: EOMI,  MMM Lungs: Normal work of breathing. CTABL Heart: RRR no MRG Abd: NABS, Soft. Nondistended, Nontender Exts: Brisk capillary refill, warm and well perfused.   Lab and Radiology Results No results found for this or any previous visit (from the past 72 hour(s)). No results found.    Assessment and Plan: 52 y.o. male with  Hypertension well-controlled continue current regimen check basic fasting labs recheck in 6 months or so.  Obesity: Improving no longer morbidly obese.  BMI less than 35.  Check basic fasting labs.  We will also follow-up mild transaminitis at that  time as well.  Recheck lipids and A1c for prediabetes.   Orders Placed This Encounter  Procedures  . CBC  . COMPLETE METABOLIC PANEL WITH GFR  . Lipid Panel w/reflex Direct LDL  . Hemoglobin A1c   No orders of the defined types were placed in this encounter.    Historical information moved to improve visibility of documentation.  Past Medical History:  Diagnosis Date  . Essential hypertension 10/10/2015  . Family history of prostate cancer 06/07/2012  . Fatigue   . Hyperlipidemia LDL goal <160 06/15/2012   [place] 2% April 2014   . Kidney calculus 05/08/2011  . Obesity (BMI 30.0-34.9)   . Psoriasis 05/01/2017  . 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 KIDNEY STONE SURGERY    . KNEE ARTHROSCOPY W/ ACL RECONSTRUCTION AND PATELLA GRAFT     Social History   Tobacco Use  . Smoking status: Never Smoker  . Smokeless tobacco: Never Used  Substance Use Topics  . Alcohol use: Not on file   family history includes Diabetes in his father and mother; Heart failure in his father; Nephrolithiasis in his sister.  Medications: Current Outpatient Medications  Medication Sig Dispense Refill  . clobetasol cream (TEMOVATE) 0.05 % Apply 1 application topically 2 (two) times daily. Use until rash resolves. 60 g 12  .  diclofenac sodium (VOLTAREN) 1 % GEL Apply 4 g topically 4 (four) times daily. To affected joint. 100 g 11  . hydrochlorothiazide (HYDRODIURIL) 25 MG tablet Take 1 tablet (25 mg total) by mouth daily. 90 tablet 3  . lisinopril (PRINIVIL,ZESTRIL) 10 MG tablet Take 1 tablet (10 mg total) by mouth daily. 90 tablet 0  . Omega-3 Fatty Acids (FISH OIL) 1000 MG CAPS One PO BID with meals  0  . omeprazole (PRILOSEC) 40 MG capsule Take 1 capsule (40 mg total) by mouth daily. 90 capsule 3  . triamcinolone cream (KENALOG) 0.1 % Apply 1 application topically 2 (two) times daily. 453.6 g 12   No current facility-administered medications for  this visit.    No Known Allergies   Discussed warning signs or symptoms. Please see discharge instructions. Patient expresses understanding.

## 2017-12-21 NOTE — Patient Instructions (Addendum)
Thank you for coming in today.  Continue current treatment.  Get labs today.   Continue healthy diet.   Recheck in 7 months or sooner if needed.   Make sure to get around 1000 units of vit D.

## 2017-12-22 LAB — LIPID PANEL W/REFLEX DIRECT LDL
Cholesterol: 203 mg/dL — ABNORMAL HIGH (ref <200)
HDL: 34 mg/dL — ABNORMAL LOW (ref >40)
LDL Cholesterol (Calc): 137 mg/dL (calc) — ABNORMAL HIGH
Non-HDL Cholesterol (Calc): 169 mg/dL (calc) — ABNORMAL HIGH (ref <130)
Total CHOL/HDL Ratio: 6 (calc) — ABNORMAL HIGH (ref <5.0)
Triglycerides: 184 mg/dL — ABNORMAL HIGH (ref <150)

## 2017-12-22 LAB — COMPLETE METABOLIC PANEL WITH GFR
AG Ratio: 1.5 (calc) (ref 1.0–2.5)
ALBUMIN MSPROF: 4.5 g/dL (ref 3.6–5.1)
ALKALINE PHOSPHATASE (APISO): 56 U/L (ref 40–115)
ALT: 43 U/L (ref 9–46)
AST: 21 U/L (ref 10–40)
BUN: 16 mg/dL (ref 7–25)
CHLORIDE: 100 mmol/L (ref 98–110)
CO2: 29 mmol/L (ref 20–32)
CREATININE: 1.17 mg/dL (ref 0.60–1.35)
Calcium: 10.1 mg/dL (ref 8.6–10.3)
GFR, EST NON AFRICAN AMERICAN: 73 mL/min/{1.73_m2} (ref >=60)
GFR, Est African American: 84 mL/min/{1.73_m2} (ref >=60)
GLUCOSE: 112 mg/dL — AB (ref 65–99)
Globulin: 3 g/dL (calc) (ref 1.9–3.7)
POTASSIUM: 4.7 mmol/L (ref 3.5–5.3)
Sodium: 138 mmol/L (ref 135–146)
Total Bilirubin: 0.6 mg/dL (ref 0.2–1.2)
Total Protein: 7.5 g/dL (ref 6.1–8.1)

## 2017-12-22 LAB — CBC
HCT: 44.3 % (ref 38.5–50.0)
HEMOGLOBIN: 15.2 g/dL (ref 13.2–17.1)
MCH: 30.2 pg (ref 27.0–33.0)
MCHC: 34.3 g/dL (ref 32.0–36.0)
MCV: 87.9 fL (ref 80.0–100.0)
MPV: 11.8 fL (ref 7.5–12.5)
Platelets: 300 10*3/uL (ref 140–400)
RBC: 5.04 10*6/uL (ref 4.20–5.80)
RDW: 12.7 % (ref 11.0–15.0)
WBC: 7.9 10*3/uL (ref 3.8–10.8)

## 2017-12-22 LAB — HEMOGLOBIN A1C
EAG (MMOL/L): 7.1 (calc)
Hgb A1c MFr Bld: 6.1 % of total Hgb — ABNORMAL HIGH (ref <5.7)
MEAN PLASMA GLUCOSE: 128 (calc)

## 2018-03-07 ENCOUNTER — Other Ambulatory Visit: Payer: Self-pay | Admitting: Family Medicine

## 2018-06-09 ENCOUNTER — Other Ambulatory Visit: Payer: Self-pay | Admitting: Family Medicine

## 2018-06-16 ENCOUNTER — Other Ambulatory Visit: Payer: Self-pay | Admitting: Family Medicine

## 2018-07-08 ENCOUNTER — Other Ambulatory Visit: Payer: Self-pay | Admitting: Family Medicine

## 2018-07-19 ENCOUNTER — Ambulatory Visit: Payer: Managed Care, Other (non HMO) | Admitting: Family Medicine

## 2018-07-24 ENCOUNTER — Other Ambulatory Visit: Payer: Self-pay | Admitting: Family Medicine

## 2018-08-02 ENCOUNTER — Ambulatory Visit: Payer: Managed Care, Other (non HMO) | Admitting: Family Medicine

## 2018-08-02 NOTE — Progress Notes (Deleted)
       Jerry Miller is a 53 y.o. male who presents to [facility]: Primary Care Sports Medicine today for ***   ROS as above:  Exam:  There were no vitals taken for this visit. Wt Readings from Last 5 Encounters:  12/21/17 230 lb (104.3 kg)  11/23/17 244 lb (110.7 kg)  06/12/17 235 lb (106.6 kg)  06/04/17 235 lb (106.6 kg)  05/29/17 235 lb 0.6 oz (106.6 kg)    Gen: Well NAD HEENT: EOMI,  MMM Lungs: Normal work of breathing. CTABL Heart: RRR no MRG Abd: NABS, Soft. Nondistended, Nontender Exts: Brisk capillary refill, warm and well perfused.   Lab and Radiology Results No results found for this or any previous visit (from the past 72 hour(s)). No results found.    Assessment and Plan: 53 y.o. male with ***  PDMP not reviewed this encounter. No orders of the defined types were placed in this encounter.  No orders of the defined types were placed in this encounter.    Historical information moved to improve visibility of documentation.  Past Medical History:  Diagnosis Date  . Essential hypertension 10/10/2015  . Family history of prostate cancer 06/07/2012  . Fatigue   . Hyperlipidemia LDL goal <160 06/15/2012   [place] 2% April 2014   . Kidney calculus 05/08/2011  . Obesity (BMI 30.0-34.9)   . Psoriasis 05/01/2017  . 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 KIDNEY STONE SURGERY    . KNEE ARTHROSCOPY W/ ACL RECONSTRUCTION AND PATELLA GRAFT     Social History   Tobacco Use  . Smoking status: Never Smoker  . Smokeless tobacco: Never Used  Substance Use Topics  . Alcohol use: Not on file   family history includes Diabetes in his father and mother; Heart failure in his father; Nephrolithiasis in his sister.  Medications: Current Outpatient Medications  Medication Sig Dispense Refill  . clobetasol cream (TEMOVATE) 0.05 %  APPLY 1 APPLICATION TOPICALLY 2 TIMES DAILY. USE UNTIL RASH RESOLVES. 60 g 12  . diclofenac sodium (VOLTAREN) 1 % GEL Apply 4 g topically 4 (four) times daily. To affected joint. 100 g 11  . hydrochlorothiazide (HYDRODIURIL) 25 MG tablet Take 1 tablet (25 mg total) by mouth daily. 90 tablet 3  . lisinopril (ZESTRIL) 10 MG tablet TAKE 1 TABLET (10 MG TOTAL) BY MOUTH DAILY. DUE FOR FOLLOW UP IN MAY 10 tablet 0  . Omega-3 Fatty Acids (FISH OIL) 1000 MG CAPS One PO BID with meals  0  . omeprazole (PRILOSEC) 40 MG capsule Take 1 capsule (40 mg total) by mouth daily. 90 capsule 3  . triamcinolone cream (KENALOG) 0.1 % Apply 1 application topically 2 (two) times daily. 453.6 g 12   No current facility-administered medications for this visit.    No Known Allergies   Discussed warning signs or symptoms. Please see discharge instructions. Patient expresses understanding.

## 2018-08-03 ENCOUNTER — Other Ambulatory Visit: Payer: Self-pay | Admitting: Family Medicine

## 2018-11-27 ENCOUNTER — Other Ambulatory Visit: Payer: Self-pay | Admitting: Family Medicine

## 2018-11-27 DIAGNOSIS — I10 Essential (primary) hypertension: Secondary | ICD-10-CM

## 2018-11-27 DIAGNOSIS — K2100 Gastro-esophageal reflux disease with esophagitis, without bleeding: Secondary | ICD-10-CM

## 2018-12-18 DIAGNOSIS — R1011 Right upper quadrant pain: Secondary | ICD-10-CM | POA: Diagnosis not present

## 2018-12-18 DIAGNOSIS — K429 Umbilical hernia without obstruction or gangrene: Secondary | ICD-10-CM | POA: Diagnosis not present

## 2018-12-18 DIAGNOSIS — R112 Nausea with vomiting, unspecified: Secondary | ICD-10-CM | POA: Diagnosis not present

## 2018-12-18 DIAGNOSIS — R1013 Epigastric pain: Secondary | ICD-10-CM | POA: Diagnosis not present

## 2018-12-18 DIAGNOSIS — N2889 Other specified disorders of kidney and ureter: Secondary | ICD-10-CM | POA: Diagnosis not present

## 2018-12-18 DIAGNOSIS — N281 Cyst of kidney, acquired: Secondary | ICD-10-CM | POA: Diagnosis not present

## 2018-12-18 DIAGNOSIS — K4090 Unilateral inguinal hernia, without obstruction or gangrene, not specified as recurrent: Secondary | ICD-10-CM | POA: Diagnosis not present

## 2018-12-20 ENCOUNTER — Ambulatory Visit (INDEPENDENT_AMBULATORY_CARE_PROVIDER_SITE_OTHER): Payer: BC Managed Care – PPO | Admitting: Family Medicine

## 2018-12-20 ENCOUNTER — Other Ambulatory Visit: Payer: Self-pay

## 2018-12-20 DIAGNOSIS — K8000 Calculus of gallbladder with acute cholecystitis without obstruction: Secondary | ICD-10-CM | POA: Diagnosis not present

## 2018-12-20 DIAGNOSIS — R140 Abdominal distension (gaseous): Secondary | ICD-10-CM | POA: Diagnosis not present

## 2018-12-20 DIAGNOSIS — K2100 Gastro-esophageal reflux disease with esophagitis, without bleeding: Secondary | ICD-10-CM | POA: Diagnosis not present

## 2018-12-20 DIAGNOSIS — R509 Fever, unspecified: Secondary | ICD-10-CM | POA: Diagnosis not present

## 2018-12-20 DIAGNOSIS — R1013 Epigastric pain: Secondary | ICD-10-CM | POA: Diagnosis not present

## 2018-12-20 DIAGNOSIS — Z20828 Contact with and (suspected) exposure to other viral communicable diseases: Secondary | ICD-10-CM | POA: Diagnosis not present

## 2018-12-20 DIAGNOSIS — R918 Other nonspecific abnormal finding of lung field: Secondary | ICD-10-CM | POA: Diagnosis not present

## 2018-12-20 DIAGNOSIS — Z9889 Other specified postprocedural states: Secondary | ICD-10-CM | POA: Diagnosis not present

## 2018-12-20 DIAGNOSIS — K810 Acute cholecystitis: Secondary | ICD-10-CM | POA: Diagnosis not present

## 2018-12-20 DIAGNOSIS — R109 Unspecified abdominal pain: Secondary | ICD-10-CM | POA: Diagnosis not present

## 2018-12-20 DIAGNOSIS — K828 Other specified diseases of gallbladder: Secondary | ICD-10-CM | POA: Diagnosis not present

## 2018-12-20 DIAGNOSIS — N2889 Other specified disorders of kidney and ureter: Secondary | ICD-10-CM | POA: Diagnosis not present

## 2018-12-20 DIAGNOSIS — J90 Pleural effusion, not elsewhere classified: Secondary | ICD-10-CM | POA: Diagnosis not present

## 2018-12-20 DIAGNOSIS — D72829 Elevated white blood cell count, unspecified: Secondary | ICD-10-CM | POA: Diagnosis not present

## 2018-12-20 DIAGNOSIS — Z79899 Other long term (current) drug therapy: Secondary | ICD-10-CM | POA: Diagnosis not present

## 2018-12-20 DIAGNOSIS — Z20822 Contact with and (suspected) exposure to covid-19: Secondary | ICD-10-CM

## 2018-12-20 DIAGNOSIS — I10 Essential (primary) hypertension: Secondary | ICD-10-CM

## 2018-12-20 DIAGNOSIS — R1011 Right upper quadrant pain: Secondary | ICD-10-CM | POA: Diagnosis not present

## 2018-12-20 MED ORDER — LISINOPRIL 10 MG PO TABS: 10.0000 mg | ORAL_TABLET | Freq: Every day | ORAL | 1 refills | Status: DC

## 2018-12-20 MED ORDER — HYDROCHLOROTHIAZIDE 25 MG PO TABS: 25.0000 mg | ORAL_TABLET | Freq: Every day | ORAL | 1 refills | Status: DC

## 2018-12-20 MED ORDER — OMEPRAZOLE 40 MG PO CPDR: 40.0000 mg | DELAYED_RELEASE_CAPSULE | Freq: Every day | ORAL | 1 refills | Status: DC

## 2018-12-20 NOTE — Progress Notes (Signed)
Virtual Visit  via Video Note  I connected with      Jerry Miller by a video enabled telemedicine application and verified that I am speaking with the correct person using two identifiers.   I discussed the limitations of evaluation and management by telemedicine and the availability of in person appointments. The patient expressed understanding and agreed to proceed.  History of Present Illness: Jerry Miller is a 53 y.o. male who would like to discuss abdominal pain and hospital follow-up.   Patient was seen in the emergency department on October 17 for upper abdominal pain nausea and vomiting.  In the ED he had work-up including blood labs that were largely normal, CT scan of the abdomen pelvis which showed some nonspecific findings showing possible mesenteric inflammation as well as a small right renal lesion. Labs did not show elevated white blood cell count.  Potassium was slightly low at 3.6 and blood sugar was elevated at 169.  Otherwise labs are normal. Patient was treated with IV morphine, promethazine dicyclomine and Zofran.  Pain was not very well controlled at discharge.  Since discharge from ED having continued ABD pain. This is improving over the last few days. Yesterday developed a fever to 101.6. Able to eat and drink. Decreased appetite. Pain present but improving. No further vomiting. No taking medicine currently.    Observations/Objective: There were no vitals taken for this visit. Wt Readings from Last 5 Encounters:  12/21/17 230 lb (104.3 kg)  11/23/17 244 lb (110.7 kg)  06/12/17 235 lb (106.6 kg)  06/04/17 235 lb (106.6 kg)  05/29/17 235 lb 0.6 oz (106.6 kg)   Exam: Appearance nontoxic no acute distress Normal Speech.  No tachypnea wheezing shortness of breath.  Able to complete sentences.  Lab and Radiology Results No results found for this or any previous visit (from the past 72 hour(s)). No results found.  CT ABDOMEN PELVIS W IV CONTRAST   Narrative:  CT ABDOMEN AND PELVIS WITH CONTRAST  INDICATION: epigastric pain, N/V  COMPARISON: None.   TECHNIQUE: CT imaging of the abdomen and pelvis was performed after the intravenous administration of 100 mL of Isovue-370 iodinated contrast. Dose reduction was utilized (automated exposure control, mA or kV adjustment based on patient size, or  iterative image reconstruction). Coronal and sagittal reformatted images were generated and reviewed.  FINDINGS:   - Lower Thorax: Calcified right lower lobe granuloma. Noncalcified 5 mm left lower lobe nodule (series 2, image 35) and right middle lobe 3 mm nodule near the fissure (image 4).   - Liver: The liver is diffusely low in attenuation, consistent with steatosis. No focal lesions. Patent hepatic veins and portal veins.   - Biliary Tree and Gallbladder: No biliary ductal dilatation. The gallbladder is unremarkable.   - Pancreas: Normal.   - Spleen: Normal.   - Adrenal Glands: Normal.   - Kidneys and Bladder: No hydronephrosis. There are simple bilateral renal cysts, measuring up to 2.2 cm on the left. There is an 8 mm intermediate density lesion in the lower pole the right kidney (series 2, image 91) with density greater than simple  fluid. Unremarkable bladder.  - Gastrointestinal Tract: No bowel obstruction or bowel wall thickening. Normal appendix.   - Peritoneal Cavity and Retroperitoneum: No free fluid. No free intraperitoneal air. Nonspecific misty mesentery.  - Lymph Nodes: No retroperitoneal, mesenteric or pelvic lymphadenopathy.   - Vasculature: Normal caliber abdominal aorta. Patent proximal aortic branch vessels.  - Reproductive: Unremarkable.   -  Musculoskeletal: No aggressive appearing osseous lesions.  - Miscellaneous: Small fat-containing right inguinal hernia. There is also small fat-containing umbilical hernia.  Impression:  Impression: 1. Mild misty mesentery which is entirely nonspecific, but can be  seen with sclerosing mesenteritis. 2. Small pulmonary nodules measuring up to 5 mm. See below guidelines. 3. Hepatic steatosis. 4. Subcentimeter right renal lesion is difficult to characterize due to small size, but does not appear consistent with a simple cyst. Consider follow-up nonemergent MRI of the abdomen with and without contrast for further evaluation.  If the patient is low risk for lung cancer, no further follow-up is recommended. If the patient is high risk for lung cancer, consider 12 month follow-up CT. (2017 Fleischner Guidelines).  CBC AND DIFFERENTIAL - Abnormal  Result Value  WBC 8.6  RBC 4.67  HGB 14.4  HCT 41.7  MCV 89  MCH 30.8  MCHC 34.5  Plt Ct 272  RDW SD 38.4  MPV 11.8 (*)  NRBC% 0.0  NRBC 0.000  NEUTROPHIL % 63.2  LYMPHOCYTE % 24.2 (*)  MONOCYTE % 9.7  Eosinophil % 2.2  BASOPHIL % 0.5  IG% 0.200  ABSOLUTE NEUTROPHIL COUNT 5.40  ABSOLUTE LYMPHOCYTE COUNT 2.1  MONO ABSOLUTE 0.8  EOS ABSOLUTE 0.2  BASO ABSOLUTE 0.0  IG ABSOLUTE 0.020  COMPREHENSIVE METABOLIC PANEL - Abnormal  Na 138  Potassium 3.6 (*)  Cl 101  CO2 25  Glucose 169 (*)  BUN 24  Creatinine 0.97  Ca 10.0  ALK PHOS 66  T Bili 0.28  Total Protein 7.6  Alb 4.5  GLOBULIN 3.1  ALBUMIN/GLOBULIN RATIO 1.5  BUN/CREAT RATIO 24.7  ALT 33  AST 18  GFR AFRICAN AMERICAN 105  Comment: African-American:  Normal GFR (glomerular filtration rate) > 60 mL/min/1.73 meters squared. < 60 may include impaired kidney function based on creatinine, age, legal sex, and race normalized to accepted average body surface area  GFR Non African American 91  Comment: Non African American:  Normal GFR (glomerular filtration rate) > 60 mL/min/1.73 meters squared. < 60 may include impaired kidney function based on creatinine, age, legal sex, and race normalized to accepted average body surface area.  AGAP 12  LIPASE - Normal  Lipase 15    Assessment and Plan: 53 y.o. male with  Abdominal pain.   Improving with unclear etiology.  Likely mesenteric-itis.  Significant gut ischemia or severe inflammation less likely given his slow symptom improvement.  Reasonable however given his mild fever to proceed with outpatient Covid testing.  Patient will do drive up testing today.  Watchful waiting recheck if not improving.  Additionally patient had renal mass seen on CT scan.  After discussion patient would like to proceed with ultrasound instead of MRI.  This should help risk stratify mass to see if he needs MRI follow-up.  Pulmonary nodule.  Patient is low risk with no smoking history no need for follow-up.   PDMP not reviewed this encounter. Orders Placed This Encounter  Procedures  . US Renal    Standing Status:   Future    Standing Expiration Date:   02/19/2020    Order Specific Question:   Reason for Exam (SYMPTOM  OR DIAGNOSIS REQUIRED)    Answer:   follow up small mass seen on CT at [facility] less than 1cm: Kidneys and Bladder: No hydronephrosis. There are simple bilateral renal cysts, measuring up to 2.2 cm on the left. There is an 8 mm intermediate density lesion in the lower pole the right kidney (  Order Specific Question:   Preferred imaging location?    Answer:   [facility]   Meds ordered this encounter  Medications  . hydrochlorothiazide (HYDRODIURIL) 25 MG tablet    Sig: Take 1 tablet (25 mg total) by mouth daily.    Dispense:  90 tablet    Refill:  1    No refills. Pt is overdue for bp check with PCP.  Marland Kitchen lisinopril (ZESTRIL) 10 MG tablet    Sig: Take 1 tablet (10 mg total) by mouth daily.    Dispense:  90 tablet    Refill:  1  . omeprazole (PRILOSEC) 40 MG capsule    Sig: Take 1 capsule (40 mg total) by mouth daily.    Dispense:  90 capsule    Refill:  1    No refills. Pt is overdue for bp check with PCP.    Follow Up Instructions:    I discussed the assessment and treatment plan with the patient. The patient was provided an opportunity to ask  questions and all were answered. The patient agreed with the plan and demonstrated an understanding of the instructions.   The patient was advised to call back or seek an in-person evaluation if the symptoms worsen or if the condition fails to improve as anticipated.  Time: 25 minutes of intraservice time, with >39 minutes of total time during today's visit.      Historical information moved to improve visibility of documentation.  Past Medical History:  Diagnosis Date  . Essential hypertension 10/10/2015  . Family history of prostate cancer 06/07/2012  . Fatigue   . Hyperlipidemia LDL goal <160 06/15/2012   [place] 2% April 2014   . Kidney calculus 05/08/2011  . Obesity (BMI 30.0-34.9)   . Psoriasis 05/01/2017  . 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 KIDNEY STONE SURGERY    . KNEE ARTHROSCOPY W/ ACL RECONSTRUCTION AND PATELLA GRAFT     Social History   Tobacco Use  . Smoking status: Never Smoker  . Smokeless tobacco: Never Used  Substance Use Topics  . Alcohol use: Not on file   family history includes Diabetes in his father and mother; Heart failure in his father; Nephrolithiasis in his sister.  Medications: Current Outpatient Medications  Medication Sig Dispense Refill  . clobetasol cream (TEMOVATE) 1.65 % APPLY 1 APPLICATION TOPICALLY 2 TIMES DAILY. USE UNTIL RASH RESOLVES. 60 g 12  . diclofenac sodium (VOLTAREN) 1 % GEL Apply 4 g topically 4 (four) times daily. To affected joint. 100 g 11  . hydrochlorothiazide (HYDRODIURIL) 25 MG tablet Take 1 tablet (25 mg total) by mouth daily. 90 tablet 1  . lisinopril (ZESTRIL) 10 MG tablet Take 1 tablet (10 mg total) by mouth daily. 90 tablet 1  . Omega-3 Fatty Acids (FISH OIL) 1000 MG CAPS One PO BID with meals  0  . omeprazole (PRILOSEC) 40 MG capsule Take 1 capsule (40 mg total) by mouth daily. 90 capsule 1  . triamcinolone cream (KENALOG) 0.1 % Apply 1 application topically 2  (two) times daily. 453.6 g 12   No current facility-administered medications for this visit.    No Known Allergies

## 2018-12-20 NOTE — Patient Instructions (Addendum)
Thank you for coming in today. Get COVID test.  Let me know if not better.  Recheck in 6 months for well adult visit/follow up HTN

## 2018-12-21 ENCOUNTER — Telehealth: Payer: Self-pay | Admitting: Family Medicine

## 2018-12-21 DIAGNOSIS — K819 Cholecystitis, unspecified: Secondary | ICD-10-CM | POA: Diagnosis not present

## 2018-12-21 DIAGNOSIS — R509 Fever, unspecified: Secondary | ICD-10-CM | POA: Diagnosis not present

## 2018-12-21 DIAGNOSIS — R918 Other nonspecific abnormal finding of lung field: Secondary | ICD-10-CM | POA: Diagnosis not present

## 2018-12-21 DIAGNOSIS — K810 Acute cholecystitis: Secondary | ICD-10-CM | POA: Diagnosis not present

## 2018-12-21 DIAGNOSIS — J90 Pleural effusion, not elsewhere classified: Secondary | ICD-10-CM | POA: Diagnosis not present

## 2018-12-21 DIAGNOSIS — K828 Other specified diseases of gallbladder: Secondary | ICD-10-CM | POA: Diagnosis not present

## 2018-12-21 DIAGNOSIS — K8000 Calculus of gallbladder with acute cholecystitis without obstruction: Secondary | ICD-10-CM | POA: Diagnosis not present

## 2018-12-21 DIAGNOSIS — R109 Unspecified abdominal pain: Secondary | ICD-10-CM | POA: Diagnosis not present

## 2018-12-21 HISTORY — PX: CHOLECYSTECTOMY: SHX55

## 2018-12-21 MED ORDER — LACTATED RINGERS IR SOLN
Status: DC
Start: ? — End: 2018-12-21

## 2018-12-21 MED ORDER — CVS TUSSIN DM CLEAR PO
4.00 | ORAL | Status: DC
Start: ? — End: 2018-12-21

## 2018-12-21 MED ORDER — OXYCODONE HCL 5 MG PO TABS
5.00 | ORAL_TABLET | ORAL | Status: DC
Start: ? — End: 2018-12-21

## 2018-12-21 MED ORDER — PANTOPRAZOLE SODIUM 40 MG PO TBEC
40.00 | DELAYED_RELEASE_TABLET | ORAL | Status: DC
Start: 2018-12-23 — End: 2018-12-21

## 2018-12-21 MED ORDER — SODIUM CHLORIDE 0.9 % IV SOLN
10.00 | INTRAVENOUS | Status: DC
Start: ? — End: 2018-12-21

## 2018-12-21 MED ORDER — METOPROLOL TARTRATE 5 MG/5ML IV SOLN
2.50 | INTRAVENOUS | Status: DC
Start: ? — End: 2018-12-21

## 2018-12-21 MED ORDER — GENERIC EXTERNAL MEDICATION
3.38 | Status: DC
Start: 2018-12-22 — End: 2018-12-21

## 2018-12-21 MED ORDER — BUPIVACAINE-EPINEPHRINE (PF) 0.25% -1:200000 IJ SOLN
INTRAMUSCULAR | Status: DC
Start: ? — End: 2018-12-21

## 2018-12-21 MED ORDER — LACTATED RINGERS IV SOLN
140.00 | INTRAVENOUS | Status: DC
Start: ? — End: 2018-12-21

## 2018-12-21 MED ORDER — ONDANSETRON HCL 4 MG/2ML IJ SOLN
4.00 | INTRAMUSCULAR | Status: DC
Start: ? — End: 2018-12-21

## 2018-12-21 MED ORDER — HYDROMORPHONE HCL 1 MG/ML IJ SOLN
0.50 | INTRAMUSCULAR | Status: DC
Start: ? — End: 2018-12-21

## 2018-12-21 MED ORDER — GENERIC EXTERNAL MEDICATION
Status: DC
Start: ? — End: 2018-12-21

## 2018-12-21 MED ORDER — LABETALOL HCL 5 MG/ML IV SOLN
5.00 | INTRAVENOUS | Status: DC
Start: ? — End: 2018-12-21

## 2018-12-21 MED ORDER — CVS TUSSIN DM CLEAR PO
2.00 | ORAL | Status: DC
Start: ? — End: 2018-12-21

## 2018-12-21 MED ORDER — LACTATED RINGERS IV SOLN
25.00 | INTRAVENOUS | Status: DC
Start: ? — End: 2018-12-21

## 2018-12-21 NOTE — Telephone Encounter (Signed)
Patient's wife called, stating that patients pain he was seen virtually for last week turned out to be gallbladder issues & patient has now had his gallbladder removied, just wanted to let his PCP know this information.

## 2018-12-22 LAB — NOVEL CORONAVIRUS, NAA: SARS-CoV-2, NAA: NOT DETECTED

## 2018-12-22 NOTE — Telephone Encounter (Signed)
I saw that in the emergency room notes.  I am glad that the emergency room doctors got to the bottom of your issue and are getting you on the road to recovery.  Jerry Miller

## 2018-12-22 NOTE — Telephone Encounter (Signed)
Patient advised of Dr Clovis Riley message.

## 2018-12-23 MED ORDER — HYDROCODONE-ACETAMINOPHEN 5-325 MG PO TABS
1.00 | ORAL_TABLET | ORAL | Status: DC
Start: ? — End: 2018-12-23

## 2018-12-23 MED ORDER — GENERIC EXTERNAL MEDICATION
Status: DC
Start: ? — End: 2018-12-23

## 2018-12-23 MED ORDER — ENOXAPARIN SODIUM 40 MG/0.4ML ~~LOC~~ SOLN
40.00 | SUBCUTANEOUS | Status: DC
Start: 2018-12-23 — End: 2018-12-23

## 2018-12-28 ENCOUNTER — Ambulatory Visit (INDEPENDENT_AMBULATORY_CARE_PROVIDER_SITE_OTHER): Payer: BC Managed Care – PPO | Admitting: Family Medicine

## 2018-12-28 ENCOUNTER — Other Ambulatory Visit: Payer: Self-pay

## 2018-12-28 ENCOUNTER — Encounter: Payer: Self-pay | Admitting: Family Medicine

## 2018-12-28 VITALS — BP 116/71 | HR 83 | Temp 98.1°F | Wt 216.0 lb

## 2018-12-28 DIAGNOSIS — E781 Pure hyperglyceridemia: Secondary | ICD-10-CM

## 2018-12-28 DIAGNOSIS — Z125 Encounter for screening for malignant neoplasm of prostate: Secondary | ICD-10-CM

## 2018-12-28 DIAGNOSIS — I10 Essential (primary) hypertension: Secondary | ICD-10-CM | POA: Diagnosis not present

## 2018-12-28 DIAGNOSIS — Z8719 Personal history of other diseases of the digestive system: Secondary | ICD-10-CM | POA: Diagnosis not present

## 2018-12-28 DIAGNOSIS — R7303 Prediabetes: Secondary | ICD-10-CM | POA: Diagnosis not present

## 2018-12-28 DIAGNOSIS — E785 Hyperlipidemia, unspecified: Secondary | ICD-10-CM

## 2018-12-28 DIAGNOSIS — Z6832 Body mass index (BMI) 32.0-32.9, adult: Secondary | ICD-10-CM

## 2018-12-28 DIAGNOSIS — Z1211 Encounter for screening for malignant neoplasm of colon: Secondary | ICD-10-CM

## 2018-12-28 DIAGNOSIS — R748 Abnormal levels of other serum enzymes: Secondary | ICD-10-CM

## 2018-12-28 NOTE — Patient Instructions (Signed)
Thank you for coming in today. Get labs in about 1 month fasting.  You should hear about colon cancer screening.  Advance diet as tolerated.  Keep fat low and 1900 calories per day.  Recheck yearly if all is well.

## 2018-12-28 NOTE — Progress Notes (Signed)
Jerry Miller is a 53 y.o. male who presents to [facility]: Primary Care Sports Medicine today for follow-up recent cholecystectomy, discuss diet for weight loss, hypertension, screening tests.  Patient had fever and subsequently was evaluated and found to have cholecystitis.  He had cholecystectomy on October 20.  He is feeling much better now but still has a little bit of residual abdominal soreness.  He is advancing his diet he was able to eat a avocado club sandwich yesterday.  He is passing normal stools now.  He still feels a bit fatigued but overall thinks he is improving.  He thinks he can return to work on Tuesday, November 3.  Additionally he is working on weight loss.  He has been working on diet and weight strategies for few months now prior to his cholecystectomy.  His goal is 1900 cal/day.  Additionally he takes lisinopril hydrochlorothiazide for hypertension.  No chest pain palpitation shortness of breath lightheadedness or dizziness.  Patient also notes that he is due for prostate and colon cancer screening.  He is willing to proceed with colonoscopy and PSA blood testing.  Patient had incidental noted renal mass seen on CT scan at [facility] earlier this month.  He has follow-up renal ultrasound scheduled for Monday, November 2.    ROS as above:  Exam:  BP 116/71   Pulse 83   Temp 98.1 F (36.7 C) (Oral)   Wt 216 lb (98 kg)   BMI 32.84 kg/m  Wt Readings from Last 5 Encounters:  12/28/18 216 lb (98 kg)  12/21/17 230 lb (104.3 kg)  11/23/17 244 lb (110.7 kg)  06/12/17 235 lb (106.6 kg)  06/04/17 235 lb (106.6 kg)    Gen: Well NAD nontoxic appearing HEENT: EOMI,  MMM Lungs: Normal work of breathing. CTABL Heart: RRR no MRG Abd: NABS, Soft. Nondistended, Nontender well-appearing surgical incision sites with no surrounding erythema or discharge. Exts: Brisk capillary refill,  warm and well perfused.      Assessment and Plan: 53 y.o. male with  Follow-up lap cholecystectomy.  Doing well overall.  Continue to advance diet as tolerated.  Recheck with general surgery as scheduled.  Recheck back with this office as needed.  Hypertension: Blood pressure controlled continue current medical regimen.  Get basic fasting labs in 1 month schedule.  Obesity: Discussed weight loss and dietary strategies.  Screening: PSA ordered for prostate cancer screening.  Refer to gastroenterology for colon cancer screening.  Also check lipid panel metabolic panel CBC and A1c to follow-up hyperlipidemia hypertension prediabetes.  If all is well recheck yearly.  PDMP not reviewed this encounter. Orders Placed This Encounter  Procedures  . CBC with Differential/Platelet  . COMPLETE METABOLIC PANEL WITH GFR  . Lipid Panel w/reflex Direct LDL  . Hemoglobin A1c  . PSA  . Ambulatory referral to Gastroenterology    Referral Priority:   Routine    Referral Type:   Consultation    Referral Reason:   Specialty Services Required    Number of Visits Requested:   1   No orders of the defined types were placed in this encounter.    Historical information moved to improve visibility of documentation.  Past Medical History:  Diagnosis Date  . Essential hypertension 10/10/2015  . Family history of prostate cancer 06/07/2012  . Fatigue   . Hyperlipidemia LDL goal <160 06/15/2012   [place] 2% April 2014   . Kidney calculus 05/08/2011  . Obesity (BMI  30.0-34.9)   . Psoriasis 05/01/2017  . 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 CHOLECYSTECTOMY  12/21/2018   [facility]  . KIDNEY STONE SURGERY    . KNEE ARTHROSCOPY W/ ACL RECONSTRUCTION AND PATELLA GRAFT     Social History   Tobacco Use  . Smoking status: Never Smoker  . Smokeless tobacco: Never Used  Substance Use Topics  . Alcohol use: Not on file   family history includes  Diabetes in his father and mother; Heart failure in his father; Nephrolithiasis in his sister.  Medications: Current Outpatient Medications  Medication Sig Dispense Refill  . clobetasol cream (TEMOVATE) 0.27 % APPLY 1 APPLICATION TOPICALLY 2 TIMES DAILY. USE UNTIL RASH RESOLVES. 60 g 12  . diclofenac sodium (VOLTAREN) 1 % GEL Apply 4 g topically 4 (four) times daily. To affected joint. 100 g 11  . hydrochlorothiazide (HYDRODIURIL) 25 MG tablet Take 1 tablet (25 mg total) by mouth daily. 90 tablet 1  . lisinopril (ZESTRIL) 10 MG tablet Take 1 tablet (10 mg total) by mouth daily. 90 tablet 1  . Omega-3 Fatty Acids (FISH OIL) 1000 MG CAPS One PO BID with meals  0  . omeprazole (PRILOSEC) 40 MG capsule Take 1 capsule (40 mg total) by mouth daily. 90 capsule 1  . triamcinolone cream (KENALOG) 0.1 % Apply 1 application topically 2 (two) times daily. 453.6 g 12   No current facility-administered medications for this visit.    No Known Allergies   Discussed warning signs or symptoms. Please see discharge instructions. Patient expresses understanding.

## 2018-12-30 ENCOUNTER — Telehealth: Payer: Self-pay | Admitting: Family Medicine

## 2018-12-30 NOTE — Telephone Encounter (Signed)
Completed [employer] short-term disability form.  Patient scheduled to return to work on November 3.  Form will be faxed and sent to scan.  Hard copy will be kept at nurses desk until document is available in media tab scanned.

## 2019-01-03 ENCOUNTER — Other Ambulatory Visit: Payer: Self-pay

## 2019-01-03 ENCOUNTER — Ambulatory Visit (INDEPENDENT_AMBULATORY_CARE_PROVIDER_SITE_OTHER): Payer: BC Managed Care – PPO

## 2019-01-03 DIAGNOSIS — N2889 Other specified disorders of kidney and ureter: Secondary | ICD-10-CM | POA: Diagnosis not present

## 2019-01-03 DIAGNOSIS — N281 Cyst of kidney, acquired: Secondary | ICD-10-CM | POA: Diagnosis not present

## 2019-01-04 ENCOUNTER — Encounter: Payer: Self-pay | Admitting: Gastroenterology

## 2019-01-04 NOTE — Progress Notes (Signed)
I am ok with order. Can we call imaging and confirm how to order this. Radiologist wanted pre and post contrast would that be MRI abdomen with contrast?

## 2019-01-06 DIAGNOSIS — U071 COVID-19: Secondary | ICD-10-CM | POA: Diagnosis not present

## 2019-01-06 DIAGNOSIS — R509 Fever, unspecified: Secondary | ICD-10-CM | POA: Diagnosis not present

## 2019-01-12 NOTE — Progress Notes (Signed)
Order placed

## 2019-02-07 ENCOUNTER — Ambulatory Visit: Payer: BC Managed Care – PPO | Admitting: Gastroenterology

## 2019-03-21 ENCOUNTER — Ambulatory Visit: Payer: BC Managed Care – PPO | Admitting: Gastroenterology

## 2019-03-28 ENCOUNTER — Encounter: Payer: Self-pay | Admitting: Family Medicine

## 2019-04-18 ENCOUNTER — Other Ambulatory Visit: Payer: Self-pay | Admitting: Neurology

## 2019-04-18 DIAGNOSIS — K2100 Gastro-esophageal reflux disease with esophagitis, without bleeding: Secondary | ICD-10-CM

## 2019-04-18 MED ORDER — OMEPRAZOLE 40 MG PO CPDR: 40.0000 mg | DELAYED_RELEASE_CAPSULE | Freq: Every day | ORAL | 0 refills | Status: DC

## 2019-06-15 ENCOUNTER — Other Ambulatory Visit: Payer: Self-pay | Admitting: Family Medicine

## 2019-06-21 NOTE — Telephone Encounter (Signed)
Pt needs to establish with a new PCP

## 2019-06-23 NOTE — Telephone Encounter (Signed)
LVM to est with new PCP for refills  

## 2019-07-14 ENCOUNTER — Other Ambulatory Visit: Payer: Self-pay | Admitting: Physician Assistant

## 2019-07-14 DIAGNOSIS — K2100 Gastro-esophageal reflux disease with esophagitis, without bleeding: Secondary | ICD-10-CM

## 2019-07-21 ENCOUNTER — Telehealth (INDEPENDENT_AMBULATORY_CARE_PROVIDER_SITE_OTHER): Payer: BC Managed Care – PPO | Admitting: Family Medicine

## 2019-07-21 ENCOUNTER — Encounter: Payer: Self-pay | Admitting: Family Medicine

## 2019-07-21 DIAGNOSIS — K2100 Gastro-esophageal reflux disease with esophagitis, without bleeding: Secondary | ICD-10-CM | POA: Diagnosis not present

## 2019-07-21 DIAGNOSIS — I10 Essential (primary) hypertension: Secondary | ICD-10-CM

## 2019-07-21 DIAGNOSIS — J069 Acute upper respiratory infection, unspecified: Secondary | ICD-10-CM

## 2019-07-21 DIAGNOSIS — R05 Cough: Secondary | ICD-10-CM | POA: Diagnosis not present

## 2019-07-21 DIAGNOSIS — R509 Fever, unspecified: Secondary | ICD-10-CM | POA: Diagnosis not present

## 2019-07-21 DIAGNOSIS — R0981 Nasal congestion: Secondary | ICD-10-CM | POA: Diagnosis not present

## 2019-07-21 DIAGNOSIS — Z20822 Contact with and (suspected) exposure to covid-19: Secondary | ICD-10-CM | POA: Diagnosis not present

## 2019-07-21 MED ORDER — LISINOPRIL 10 MG PO TABS: 10.0000 mg | ORAL_TABLET | Freq: Every day | ORAL | 0 refills | Status: DC

## 2019-07-21 MED ORDER — OMEPRAZOLE 40 MG PO CPDR: 40.0000 mg | DELAYED_RELEASE_CAPSULE | Freq: Every day | ORAL | 0 refills | Status: DC

## 2019-07-21 MED ORDER — HYDROCHLOROTHIAZIDE 25 MG PO TABS: 25.0000 mg | ORAL_TABLET | Freq: Every day | ORAL | 0 refills | Status: DC

## 2019-07-21 NOTE — Progress Notes (Signed)
Jerry Miller - 54 y.o. male MRN [id number]  Date of birth: Oct 31, 1965   This visit type was conducted due to national recommendations for restrictions regarding the COVID-19 Pandemic (e.g. social distancing).  This format is felt to be most appropriate for this patient at this time.  All issues noted in this document were discussed and addressed.  No physical exam was performed (except for noted visual exam findings with Video Visits).  I discussed the limitations of evaluation and management by telemedicine and the availability of in person appointments. The patient expressed understanding and agreed to proceed.  I connected with@ on 07/21/19 at  2:20 PM EDT by a video enabled telemedicine application and verified that I am speaking with the correct person using two identifiers.  Present at visit: Everrett Coombe, DO [place] [facility]   Patient Location: Home [address] [place] [address]   Provider location:   [facility]  Chief Complaint  Patient presents with  . Possible COVID    HPI  Jerry Miller is a 54 y.o. male who presents via audio/video conferencing for a telehealth visit today.  He has complaint of nasal congestion, drainage and sore throat.  Developed fever up to 102 yesterday but this has resolved today.  Other symptoms have improved as well.  Temp today has not been above 99.  He denies shortness of breath, chest tightness, body aches, nausea, vomiting, diarrhea..  He has had COVID in 01/2019 and had 1st moderna vaccine.  Received negative COVID test today.    ROS:  A comprehensive ROS was completed and negative except as noted per HPI  Past Medical History:  Diagnosis Date  . Essential hypertension 10/10/2015  . Family history of prostate cancer 06/07/2012  . Fatigue   . Hyperlipidemia LDL goal <160 06/15/2012   [place] 2% April 2014   . Kidney calculus 05/08/2011  . Obesity (BMI 30.0-34.9)   . Psoriasis 05/01/2017  . 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 CHOLECYSTECTOMY  12/21/2018   [facility]  . KIDNEY STONE SURGERY    . KNEE ARTHROSCOPY W/ ACL RECONSTRUCTION AND PATELLA GRAFT      Family History  Problem Relation Age of Onset  . Diabetes Mother   . Diabetes Father   . Heart failure Father   . Nephrolithiasis Sister     Social History   Socioeconomic History  . Marital status: Married    Spouse name: Not on file  . Number of children: Not on file  . Years of education: Not on file  . Highest education level: Not on file  Occupational History  . Not on file  Tobacco Use  . Smoking status: Never Smoker  . Smokeless tobacco: Never Used  Substance and Sexual Activity  . Alcohol use: Not on file  . Drug use: Not on file  . Sexual activity: Yes    Birth control/protection: Implant  Other Topics Concern  . Not on file  Social History Narrative  . Not on file   Social Determinants of Health   Financial Resource Strain:   . Difficulty of Paying Living Expenses:   Food Insecurity:   . Worried About Programme researcher, broadcasting/film/video in the Last Year:   . Barista in the Last Year:   Transportation Needs:   . Freight forwarder (Medical):   Marland Kitchen Lack of Transportation (Non-Medical):   Physical Activity:   . Days of Exercise per Week:   .  Minutes of Exercise per Session:   Stress:   . Feeling of Stress :   Social Connections:   . Frequency of Communication with Friends and Family:   . Frequency of Social Gatherings with Friends and Family:   . Attends Religious Services:   . Active Member of Clubs or Organizations:   . Attends Archivist Meetings:   Marland Kitchen Marital Status:   Intimate Partner Violence:   . Fear of Current or Ex-Partner:   . Emotionally Abused:   Marland Kitchen Physically Abused:   . Sexually Abused:      Current Outpatient Medications:  .  clobetasol cream (TEMOVATE) 5.36 %, APPLY 1 APPLICATION TOPICALLY 2 TIMES DAILY. USE UNTIL RASH RESOLVES., Disp: 60  g, Rfl: 12 .  diclofenac sodium (VOLTAREN) 1 % GEL, Apply 4 g topically 4 (four) times daily. To affected joint., Disp: 100 g, Rfl: 11 .  hydrochlorothiazide (HYDRODIURIL) 25 MG tablet, Take 1 tablet (25 mg total) by mouth daily., Disp: 30 tablet, Rfl: 0 .  lisinopril (ZESTRIL) 10 MG tablet, Take 1 tablet (10 mg total) by mouth daily., Disp: 30 tablet, Rfl: 0 .  Omega-3 Fatty Acids (FISH OIL) 1000 MG CAPS, One PO BID with meals, Disp: , Rfl: 0 .  omeprazole (PRILOSEC) 40 MG capsule, Take 1 capsule (40 mg total) by mouth daily. NEEDS LABS, Disp: 30 capsule, Rfl: 0 .  triamcinolone cream (KENALOG) 0.1 %, Apply 1 application topically 2 (two) times daily., Disp: 453.6 g, Rfl: 12  EXAM:  VITALS per patient if applicable: There were no vitals taken for this visit.  GENERAL: alert, oriented, appears well and in no acute distress  HEENT: atraumatic, conjunttiva clear, no obvious abnormalities on inspection of external nose and ears  NECK: normal movements of the head and neck  LUNGS: on inspection no signs of respiratory distress, breathing rate appears normal, no obvious gross SOB, gasping or wheezing  CV: no obvious cyanosis  MS: moves all visible extremities without noticeable abnormality  PSYCH/NEURO: pleasant and cooperative, no obvious depression or anxiety, speech and thought processing grossly intact  ASSESSMENT AND PLAN:  Discussed the following assessment and plan:  URI (upper respiratory infection) Negative COVID test.  Recommend continued supportive care.  Call for new or worsening symptoms.     20 minutes spent including pre visit preparation, review of prior notes and labs, encounter with patient via video visit and same day documentation.  I discussed the assessment and treatment plan with the patient. The patient was provided an opportunity to ask questions and all were answered. The patient agreed with the plan and demonstrated an understanding of the instructions.    The patient was advised to call back or seek an in-person evaluation if the symptoms worsen or if the condition fails to improve as anticipated.    Luetta Nutting, DO

## 2019-07-21 NOTE — Assessment & Plan Note (Signed)
Negative COVID test.  Recommend continued supportive care.  Call for new or worsening symptoms.

## 2019-07-21 NOTE — Progress Notes (Signed)
Attempted to call patient.  No answer.

## 2019-08-03 NOTE — Telephone Encounter (Signed)
Est already with Ashley Royalty

## 2019-08-25 ENCOUNTER — Other Ambulatory Visit: Payer: Self-pay | Admitting: Family Medicine

## 2019-08-25 DIAGNOSIS — K2100 Gastro-esophageal reflux disease with esophagitis, without bleeding: Secondary | ICD-10-CM

## 2019-09-06 ENCOUNTER — Other Ambulatory Visit: Payer: Self-pay

## 2019-09-06 ENCOUNTER — Ambulatory Visit (INDEPENDENT_AMBULATORY_CARE_PROVIDER_SITE_OTHER): Payer: BC Managed Care – PPO | Admitting: Sports Medicine

## 2019-09-06 ENCOUNTER — Encounter: Payer: Self-pay | Admitting: Sports Medicine

## 2019-09-06 ENCOUNTER — Ambulatory Visit (INDEPENDENT_AMBULATORY_CARE_PROVIDER_SITE_OTHER): Payer: BC Managed Care – PPO

## 2019-09-06 DIAGNOSIS — M1712 Unilateral primary osteoarthritis, left knee: Secondary | ICD-10-CM | POA: Diagnosis not present

## 2019-09-06 DIAGNOSIS — M1711 Unilateral primary osteoarthritis, right knee: Secondary | ICD-10-CM | POA: Diagnosis not present

## 2019-09-06 DIAGNOSIS — M1731 Unilateral post-traumatic osteoarthritis, right knee: Secondary | ICD-10-CM

## 2019-09-06 DIAGNOSIS — E669 Obesity, unspecified: Secondary | ICD-10-CM

## 2019-09-06 DIAGNOSIS — E66811 Obesity, class 1: Secondary | ICD-10-CM

## 2019-09-06 MED ORDER — MELOXICAM 15 MG PO TABS: ORAL_TABLET | ORAL | 3 refills | Status: DC

## 2019-09-06 NOTE — Assessment & Plan Note (Signed)
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 He has a long history of right knee pain, he has a history of a meniscal tear, with locking knee, put off for several years, ultimately he ended up needing an arthroscopy with partial meniscectomy and ACL reconstruction. Since then he has had increasing pain in the knee, swelling, moderate gelling. Today we aspirated, injected the knee, adding home rehab exercises, baseline x-rays, meloxicam. Return to see me in a month. I am going to put him on some restricted duty at work for now.

## 2019-09-06 NOTE — Addendum Note (Signed)
Addended by: Monica Becton on: 09/06/2019 02:39 PM   Modules accepted: Orders

## 2019-09-06 NOTE — Progress Notes (Addendum)
    Procedures performed today:    Procedure: Real-time Ultrasound Guided  aspiration/injection of right knee Device: Samsung HS60  Verbal informed consent obtained.  Time-out conducted.  Noted no overlying erythema, induration, or other signs of local infection.  Skin prepped in a sterile fashion.  Local anesthesia: Topical Ethyl chloride.  With sterile technique and under real time ultrasound guidance:  Aspirated 40 cc of clear, straw-colored fluid, syringe switched and 1 cc Kenalog 40, 2 cc lidocaine, 2 cc bupivacaine injected easily Completed without difficulty  Pain immediately resolved suggesting accurate placement of the medication.  Advised to call if fevers/chills, erythema, induration, drainage, or persistent bleeding.  Images permanently stored and available for review in the ultrasound unit.  Impression: Technically successful ultrasound guided injection.  Independent interpretation of notes and tests performed by another provider:   None.  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 He has a long history of right knee pain, he has a history of a meniscal tear, with locking knee, put off for several years, ultimately he ended up needing an arthroscopy with partial meniscectomy and ACL reconstruction. Since then he has had increasing pain in the knee, swelling, moderate gelling. Today we aspirated, injected the knee, adding home rehab exercises, baseline x-rays, meloxicam. Return to see me in a month. I am going to put him on some restricted duty at work for now.  Obesity (BMI 30.0-34.9) Nilay also needs to lose a lot of weight, I would like him to set up with the healthy weight and wellness center.    ___________________________________________ Ihor Austin. Benjamin Stain, M.D., ABFM., CAQSM. Primary Care and Sports  Medicine [facility] [facility]  Adjunct Instructor of Family Medicine  [employer]

## 2019-09-06 NOTE — Assessment & Plan Note (Signed)
Jerry Miller also needs to lose a lot of weight, I would like him to set up with the healthy weight and wellness center.

## 2019-09-06 NOTE — Addendum Note (Signed)
Addended by: Monica Becton on: 09/06/2019 02:35 PM   Modules accepted: Orders

## 2019-09-13 ENCOUNTER — Encounter: Payer: Self-pay | Admitting: Family Medicine

## 2019-09-13 ENCOUNTER — Ambulatory Visit (INDEPENDENT_AMBULATORY_CARE_PROVIDER_SITE_OTHER): Payer: BC Managed Care – PPO | Admitting: Family Medicine

## 2019-09-13 ENCOUNTER — Ambulatory Visit (INDEPENDENT_AMBULATORY_CARE_PROVIDER_SITE_OTHER): Payer: BC Managed Care – PPO | Admitting: Sports Medicine

## 2019-09-13 VITALS — BP 157/86 | HR 70 | Temp 98.8°F | Ht 68.11 in | Wt 244.5 lb

## 2019-09-13 DIAGNOSIS — K2100 Gastro-esophageal reflux disease with esophagitis, without bleeding: Secondary | ICD-10-CM

## 2019-09-13 DIAGNOSIS — Z0000 Encounter for general adult medical examination without abnormal findings: Secondary | ICD-10-CM

## 2019-09-13 DIAGNOSIS — N2889 Other specified disorders of kidney and ureter: Secondary | ICD-10-CM

## 2019-09-13 DIAGNOSIS — L237 Allergic contact dermatitis due to plants, except food: Secondary | ICD-10-CM

## 2019-09-13 DIAGNOSIS — I10 Essential (primary) hypertension: Secondary | ICD-10-CM | POA: Diagnosis not present

## 2019-09-13 DIAGNOSIS — L259 Unspecified contact dermatitis, unspecified cause: Secondary | ICD-10-CM | POA: Insufficient documentation

## 2019-09-13 DIAGNOSIS — Z1211 Encounter for screening for malignant neoplasm of colon: Secondary | ICD-10-CM

## 2019-09-13 DIAGNOSIS — E781 Pure hyperglyceridemia: Secondary | ICD-10-CM | POA: Diagnosis not present

## 2019-09-13 DIAGNOSIS — T7840XA Allergy, unspecified, initial encounter: Secondary | ICD-10-CM | POA: Diagnosis not present

## 2019-09-13 DIAGNOSIS — M1731 Unilateral post-traumatic osteoarthritis, right knee: Secondary | ICD-10-CM | POA: Diagnosis not present

## 2019-09-13 DIAGNOSIS — Z8042 Family history of malignant neoplasm of prostate: Secondary | ICD-10-CM

## 2019-09-13 DIAGNOSIS — N281 Cyst of kidney, acquired: Secondary | ICD-10-CM

## 2019-09-13 DIAGNOSIS — Z125 Encounter for screening for malignant neoplasm of prostate: Secondary | ICD-10-CM

## 2019-09-13 MED ORDER — PREDNISONE 10 MG PO TABS: ORAL_TABLET | ORAL | 0 refills | Status: DC

## 2019-09-13 MED ORDER — HYDROCHLOROTHIAZIDE 25 MG PO TABS: 25.0000 mg | ORAL_TABLET | Freq: Every day | ORAL | 2 refills | Status: DC

## 2019-09-13 MED ORDER — LISINOPRIL 10 MG PO TABS: 10.0000 mg | ORAL_TABLET | Freq: Every day | ORAL | 2 refills | Status: DC

## 2019-09-13 MED ORDER — OMEPRAZOLE 40 MG PO CPDR: 40.0000 mg | DELAYED_RELEASE_CAPSULE | Freq: Every day | ORAL | 2 refills | Status: DC

## 2019-09-13 MED ORDER — METHYLPREDNISOLONE ACETATE 80 MG/ML IJ SUSP
80.0000 mg | Freq: Once | INTRAMUSCULAR | Status: AC
  Administered 2019-09-13: 80 mg via INTRAMUSCULAR

## 2019-09-13 NOTE — Assessment & Plan Note (Signed)
Doing much better after an aspiration and injection at the last visit, today we filled out some of his lead paperwork. Return to see me in about 3 weeks.

## 2019-09-13 NOTE — Assessment & Plan Note (Signed)
Rash consistent with poison ivy dermatitis.  Given injection of depo-medrol in office today and will start steroid taper.

## 2019-09-13 NOTE — Progress Notes (Signed)
Baraka Klatt - 54 y.o. male MRN [id number]  Date of birth: Dec 28, 1965  Subjective Chief Complaint  Patient presents with  . Annual Exam    HPI Jerry Miller is a 54 y.o. male here today for annual exam.  He has a history of HTN, prediabetes and HLD.  He also has history of bilateral complex renal cysts noted on [place].  He was supposed to have MRI for further evaluation of these but never scheduled.  He questions if he still needs this.    He also has rash related to poison ivy located on bilateral arms, trunk and face. Present for a few days.  He has tried calamine without much relief.    He is due for colon cancer screening.  Would like to have colonoscopy.   He is a non-smoker and denies EtOH use.   He has been working dietary modification and exercise to lose weight.   Review of Systems  Constitutional: Negative for chills, fever, malaise/fatigue and weight loss.  HENT: Negative for congestion, ear pain and sore throat.   Eyes: Negative for blurred vision, double vision and pain.  Respiratory: Negative for cough and shortness of breath.   Cardiovascular: Negative for chest pain and palpitations.  Gastrointestinal: Negative for abdominal pain, blood in stool, constipation, heartburn and nausea.  Genitourinary: Negative for dysuria and urgency.  Musculoskeletal: Negative for joint pain and myalgias.  Skin: Positive for rash.  Neurological: Negative for dizziness and headaches.  Endo/Heme/Allergies: Does not bruise/bleed easily.  Psychiatric/Behavioral: Negative for depression. The patient is not nervous/anxious and does not have insomnia.     No Known Allergies  Past Medical History:  Diagnosis Date  . Essential hypertension 10/10/2015  . Family history of prostate cancer 06/07/2012  . Fatigue   . Hyperlipidemia LDL goal <160 06/15/2012   [place] 2% April 2014   . Kidney calculus 05/08/2011  . Obesity (BMI 30.0-34.9)   . Psoriasis 05/01/2017  . 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 CHOLECYSTECTOMY  12/21/2018   [facility]  . KIDNEY STONE SURGERY    . KNEE ARTHROSCOPY W/ ACL RECONSTRUCTION AND PATELLA GRAFT      Social History   Socioeconomic History  . Marital status: Married    Spouse name: Not on file  . Number of children: Not on file  . Years of education: Not on file  . Highest education level: Not on file  Occupational History  . Not on file  Tobacco Use  . Smoking status: Never Smoker  . Smokeless tobacco: Never Used  Substance and Sexual Activity  . Alcohol use: Not on file  . Drug use: Not on file  . Sexual activity: Yes    Birth control/protection: Implant  Other Topics Concern  . Not on file  Social History Narrative  . Not on file   Social Determinants of Health   Financial Resource Strain:   . Difficulty of Paying Living Expenses:   Food Insecurity:   . Worried About Programme researcher, broadcasting/film/video in the Last Year:   . Barista in the Last Year:   Transportation Needs:   . Freight forwarder (Medical):   Marland Kitchen Lack of Transportation (Non-Medical):   Physical Activity:   . Days of Exercise per Week:   . Minutes of Exercise per Session:   Stress:   . Feeling of Stress :   Social Connections:   . Frequency of Communication with  Friends and Family:   . Frequency of Social Gatherings with Friends and Family:   . Attends Religious Services:   . Active Member of Clubs or Organizations:   . Attends Banker Meetings:   Marland Kitchen Marital Status:     Family History  Problem Relation Age of Onset  . Diabetes Mother   . Diabetes Father   . Heart failure Father   . Nephrolithiasis Sister     Health Maintenance  Topic Date Due  . Hepatitis C Screening  Never done  . COVID-19 Vaccine (1) Never done  . COLONOSCOPY  Never done  . INFLUENZA VACCINE  10/02/2019  . TETANUS/TDAP  05/07/2021  . HIV Screening  Completed      ----------------------------------------------------------------------------------------------------------------------------------------------------------------------------------------------------------------- Physical Exam BP (!) 157/86 (BP Location: Left Arm, Patient Position: Sitting, Cuff Size: Large)   Pulse 70   Temp 98.8 F (37.1 C) (Oral)   Ht 5' 8.11" (1.73 m)   Wt 244 lb 8 oz (110.9 kg)   SpO2 100%   BMI 37.06 kg/m   Physical Exam Constitutional:      General: He is not in acute distress. HENT:     Head: Normocephalic and atraumatic.     Right Ear: External ear normal.     Left Ear: External ear normal.  Eyes:     General: No scleral icterus. Neck:     Thyroid: No thyromegaly.  Cardiovascular:     Rate and Rhythm: Normal rate and regular rhythm.     Heart sounds: Normal heart sounds.  Pulmonary:     Effort: Pulmonary effort is normal.     Breath sounds: Normal breath sounds.  Abdominal:     General: Bowel sounds are normal. There is no distension.     Palpations: Abdomen is soft.     Tenderness: There is no abdominal tenderness. There is no guarding.  Musculoskeletal:     Cervical back: Normal range of motion.  Lymphadenopathy:     Cervical: No cervical adenopathy.  Skin:    Findings: Rash (erythematous rash to bilateral forearms, face and trunk.  There area few small vesicular lesions.  ) present.  Neurological:     Mental Status: He is alert and oriented to person, place, and time.     Cranial Nerves: No cranial nerve deficit.     Motor: No abnormal muscle tone.  Psychiatric:        Mood and Affect: Mood normal.        Behavior: Behavior normal.     ------------------------------------------------------------------------------------------------------------------------------------------------------------------------------------------------------------------- Assessment and Plan  Essential hypertension Blood pressure is not at goal at for age and  co-morbidities.  He has been out of hctz which I renewed today .  In addition they were instructed to follow a low sodium diet with regular exercise to help to maintain adequate control of blood pressure.    Renal mass Renal lesion noted on previous CT and [place].   MRI of abdomen/pelvis ordered for further evaluation of this.    Contact dermatitis Rash consistent with poison ivy dermatitis.  Given injection of depo-medrol in office today and will start steroid taper.   Well adult exam Well adult Orders Placed This Encounter  Procedures  . MR Abdomen W Wo Contrast    Standing Status:   Future    Standing Expiration Date:   09/12/2020    Order Specific Question:   ** REASON FOR EXAM (FREE TEXT)    Answer:   complex renal cyst  Order Specific Question:   If indicated for the ordered procedure, I authorize the administration of contrast media per Radiology protocol    Answer:   Yes    Order Specific Question:   What is the patient's sedation requirement?    Answer:   No Sedation    Order Specific Question:   Does the patient have a pacemaker or implanted devices?    Answer:   No    Order Specific Question:   Radiology Contrast Protocol - do NOT remove file path    Answer:   \\[email or web address]    Order Specific Question:   Preferred imaging location?    Answer:   Licensed conveyancer (table limit-350lbs)  . COMPLETE METABOLIC PANEL WITH GFR  . CBC  . Lipid Profile  . PSA  . Ambulatory referral to Gastroenterology    Referral Priority:   Routine    Referral Type:   Consultation    Referral Reason:   Specialty Services Required    Number of Visits Requested:   1  Screening: Referral for colonoscopy, PSA Immunizations: UTD Anticipatory guidance/Risk factor reduction:  See additional problems addressed today.  Encouraged weight loss and regular exercise.  Additional recommendatons per AVS   Meds ordered this encounter  Medications  . predniSONE  (DELTASONE) 10 MG tablet    Sig: Take 40mg  x3 days, 30mg  x3 days, 20mg  x3 days, 10mg x3 days, 5mg x3 days.    Dispense:  32 tablet    Refill:  0  . hydrochlorothiazide (HYDRODIURIL) 25 MG tablet    Sig: Take 1 tablet (25 mg total) by mouth daily.    Dispense:  90 tablet    Refill:  2  . lisinopril (ZESTRIL) 10 MG tablet    Sig: Take 1 tablet (10 mg total) by mouth daily.    Dispense:  90 tablet    Refill:  2  . omeprazole (PRILOSEC) 40 MG capsule    Sig: Take 1 capsule (40 mg total) by mouth daily.    Dispense:  90 capsule    Refill:  2  . methylPREDNISolone acetate (DEPO-MEDROL) injection 80 mg    Return in about 8 weeks (around 11/08/2019) for HTN.    This visit occurred during the SARS-CoV-2 public health emergency.  Safety protocols were in place, including screening questions prior to the visit, additional usage of staff PPE, and extensive cleaning of exam room while observing appropriate contact time as indicated for disinfecting solutions.

## 2019-09-13 NOTE — Assessment & Plan Note (Signed)
Renal lesion noted on previous CT and [place].   MRI of abdomen/pelvis ordered for further evaluation of this.

## 2019-09-13 NOTE — Progress Notes (Signed)
    Procedures performed today:    None.  Independent interpretation of notes and tests performed by another provider:   None.  Brief History, Exam, Impression, and Recommendations:    Post-traumatic osteoarthritis of right knee Doing much better after an aspiration and injection at the last visit, today we filled out some of his lead paperwork. Return to see me in about 3 weeks.    ___________________________________________ Ihor Austin. Benjamin Stain, M.D., ABFM., CAQSM. Primary Care and Sports Medicine  [facility]  Adjunct Instructor of Family Medicine  [employer]

## 2019-09-13 NOTE — Patient Instructions (Signed)
Preventive Care 52-52 Years Old, Male Preventive care refers to lifestyle choices and visits with your health care provider that can promote health and wellness. This includes:  A yearly physical exam. This is also called an annual well check.  Regular dental and eye exams.  Immunizations.  Screening for certain conditions.  Healthy lifestyle choices, such as eating a healthy diet, getting regular exercise, not using drugs or products that contain nicotine and tobacco, and limiting alcohol use. What can I expect for my preventive care visit? Physical exam Your health care provider will check:  Height and weight. These may be used to calculate body mass index (BMI), which is a measurement that tells if you are at a healthy weight.  Heart rate and blood pressure.  Your skin for abnormal spots. Counseling Your health care provider may ask you questions about:  Alcohol, tobacco, and drug use.  Emotional well-being.  Home and relationship well-being.  Sexual activity.  Eating habits.  Work and work Statistician. What immunizations do I need?  Influenza (flu) vaccine  This is recommended every year. Tetanus, diphtheria, and pertussis (Tdap) vaccine  You may need a Td booster every 10 years. Varicella (chickenpox) vaccine  You may need this vaccine if you have not already been vaccinated. Zoster (shingles) vaccine  You may need this after age 52. Measles, mumps, and rubella (MMR) vaccine  You may need at least one dose of MMR if you were born in 1957 or later. You may also need a second dose. Pneumococcal conjugate (PCV13) vaccine  You may need this if you have certain conditions and were not previously vaccinated. Pneumococcal polysaccharide (PPSV23) vaccine  You may need one or two doses if you smoke cigarettes or if you have certain conditions. Meningococcal conjugate (MenACWY) vaccine  You may need this if you have certain conditions. Hepatitis A  vaccine  You may need this if you have certain conditions or if you travel or work in places where you may be exposed to hepatitis A. Hepatitis B vaccine  You may need this if you have certain conditions or if you travel or work in places where you may be exposed to hepatitis B. Haemophilus influenzae type b (Hib) vaccine  You may need this if you have certain risk factors. Human papillomavirus (HPV) vaccine  If recommended by your health care provider, you may need three doses over 6 months. You may receive vaccines as individual doses or as more than one vaccine together in one shot (combination vaccines). Talk with your health care provider about the risks and benefits of combination vaccines. What tests do I need? Blood tests  Lipid and cholesterol levels. These may be checked every 5 years, or more frequently if you are over 60 years old.  Hepatitis C test.  Hepatitis B test. Screening  Lung cancer screening. You may have this screening every year starting at age 52 if you have a 30-pack-year history of smoking and currently smoke or have quit within the past 15 years.  Prostate cancer screening. Recommendations will vary depending on your family history and other risks.  Colorectal cancer screening. All adults should have this screening starting at age 52 and continuing until age 2. Your health care provider may recommend screening at age 52 if you are at increased risk. You will have tests every 1-10 years, depending on your results and the type of screening test.  Diabetes screening. This is done by checking your blood sugar (glucose) after you have not eaten  for a while (fasting). You may have this done every 1-3 years.  Sexually transmitted disease (STD) testing. Follow these instructions at home: Eating and drinking  Eat a diet that includes fresh fruits and vegetables, whole grains, lean protein, and low-fat dairy products.  Take vitamin and mineral supplements as  recommended by your health care provider.  Do not drink alcohol if your health care provider tells you not to drink.  If you drink alcohol: ? Limit how much you have to 0-2 drinks a day. ? Be aware of how much alcohol is in your drink. In the [place] one drink equals one 12 oz bottle of beer (355 mL), one 5 oz glass of wine (148 mL), or one 1 oz glass of hard liquor (44 mL). Lifestyle  Take daily care of your teeth and gums.  Stay active. Exercise for at least 30 minutes on 5 or more days each week.  Do not use any products that contain nicotine or tobacco, such as cigarettes, e-cigarettes, and chewing tobacco. If you need help quitting, ask your health care provider.  If you are sexually active, practice safe sex. Use a condom or other form of protection to prevent STIs (sexually transmitted infections).  Talk with your health care provider about taking a low-dose aspirin every day starting at age 52. What's next?  Go to your health care provider once a year for a well check visit.  Ask your health care provider how often you should have your eyes and teeth checked.  Stay up to date on all vaccines. This information is not intended to replace advice given to you by your health care provider. Make sure you discuss any questions you have with your health care provider. Document Revised: 02/11/2018 Document Reviewed: 02/11/2018 Elsevier Patient Education  2020 [employer].

## 2019-09-13 NOTE — Assessment & Plan Note (Signed)
Blood pressure is not at goal at for age and co-morbidities.  He has been out of hctz which I renewed today .  In addition they were instructed to follow a low sodium diet with regular exercise to help to maintain adequate control of blood pressure.

## 2019-09-13 NOTE — Assessment & Plan Note (Signed)
Well adult Orders Placed This Encounter  Procedures  . MR Abdomen W Wo Contrast    Standing Status:   Future    Standing Expiration Date:   09/12/2020    Order Specific Question:   ** REASON FOR EXAM (FREE TEXT)    Answer:   complex renal cyst    Order Specific Question:   If indicated for the ordered procedure, I authorize the administration of contrast media per Radiology protocol    Answer:   Yes    Order Specific Question:   What is the patient's sedation requirement?    Answer:   No Sedation    Order Specific Question:   Does the patient have a pacemaker or implanted devices?    Answer:   No    Order Specific Question:   Radiology Contrast Protocol - do NOT remove file path    Answer:   \\[email or web address]    Order Specific Question:   Preferred imaging location?    Answer:   Licensed conveyancer (table limit-350lbs)  . COMPLETE METABOLIC PANEL WITH GFR  . CBC  . Lipid Profile  . PSA  . Ambulatory referral to Gastroenterology    Referral Priority:   Routine    Referral Type:   Consultation    Referral Reason:   Specialty Services Required    Number of Visits Requested:   1  Screening: Referral for colonoscopy, PSA Immunizations: UTD Anticipatory guidance/Risk factor reduction:  See additional problems addressed today.  Encouraged weight loss and regular exercise.  Additional recommendatons per AVS

## 2019-09-21 ENCOUNTER — Encounter (INDEPENDENT_AMBULATORY_CARE_PROVIDER_SITE_OTHER): Payer: Self-pay

## 2019-10-03 ENCOUNTER — Encounter: Payer: Self-pay | Admitting: Sports Medicine

## 2019-10-03 ENCOUNTER — Ambulatory Visit (INDEPENDENT_AMBULATORY_CARE_PROVIDER_SITE_OTHER): Payer: BC Managed Care – PPO | Admitting: Sports Medicine

## 2019-10-03 ENCOUNTER — Other Ambulatory Visit: Payer: Self-pay

## 2019-10-03 ENCOUNTER — Telehealth: Payer: Self-pay | Admitting: Sports Medicine

## 2019-10-03 DIAGNOSIS — M1731 Unilateral post-traumatic osteoarthritis, right knee: Secondary | ICD-10-CM

## 2019-10-03 DIAGNOSIS — Z8042 Family history of malignant neoplasm of prostate: Secondary | ICD-10-CM | POA: Diagnosis not present

## 2019-10-03 DIAGNOSIS — E781 Pure hyperglyceridemia: Secondary | ICD-10-CM | POA: Diagnosis not present

## 2019-10-03 DIAGNOSIS — Z0000 Encounter for general adult medical examination without abnormal findings: Secondary | ICD-10-CM | POA: Diagnosis not present

## 2019-10-03 DIAGNOSIS — Z125 Encounter for screening for malignant neoplasm of prostate: Secondary | ICD-10-CM | POA: Diagnosis not present

## 2019-10-03 NOTE — Assessment & Plan Note (Signed)
Jerry Miller returns, he is a 54 year old male, he has a history of an ACL reconstruction with posttraumatic osteoarthritis, he was initially doing really well after aspiration and injection, however his pain is back to baseline which is still moderate. He is going to get more diligent with using his meloxicam, he has not yet made an appointment with the weight loss clinic. We discussed other options including viscosupplementation, he is interested in learning more about this and proceeding with the approval process, he can return to start viscosupplementation if and when he desires.

## 2019-10-03 NOTE — Progress Notes (Signed)
    Procedures performed today:    None.  Independent interpretation of notes and tests performed by another provider:   None.  Brief History, Exam, Impression, and Recommendations:    Post-traumatic osteoarthritis of right knee Jerry Miller returns, he is a 54 year old male, he has a history of an ACL reconstruction with posttraumatic osteoarthritis, he was initially doing really well after aspiration and injection, however his pain is back to baseline which is still moderate. He is going to get more diligent with using his meloxicam, he has not yet made an appointment with the weight loss clinic. We discussed other options including viscosupplementation, he is interested in learning more about this and proceeding with the approval process, he can return to start viscosupplementation if and when he desires.    ___________________________________________ Jerry Miller. Benjamin Stain, M.D., ABFM., CAQSM. Primary Care and Sports Medicine [place] [facility]  Adjunct Instructor of Family Medicine  [employer]

## 2019-10-03 NOTE — Telephone Encounter (Signed)
Orthovisc approval please, right knee, x-ray confirmed, failed everything including steroid injections

## 2019-10-04 LAB — COMPLETE METABOLIC PANEL WITH GFR
AG Ratio: 1.6 (calc) (ref 1.0–2.5)
ALT: 31 U/L (ref 9–46)
AST: 15 U/L (ref 10–35)
Albumin: 4.4 g/dL (ref 3.6–5.1)
Alkaline phosphatase (APISO): 64 U/L (ref 35–144)
BUN: 19 mg/dL (ref 7–25)
CO2: 28 mmol/L (ref 20–32)
Calcium: 9.5 mg/dL (ref 8.6–10.3)
Chloride: 101 mmol/L (ref 98–110)
Creat: 0.96 mg/dL (ref 0.70–1.33)
GFR, Est African American: 106 mL/min/{1.73_m2} (ref >=60)
GFR, Est Non African American: 91 mL/min/{1.73_m2} (ref >=60)
Globulin: 2.8 g/dL (calc) (ref 1.9–3.7)
Glucose, Bld: 115 mg/dL — ABNORMAL HIGH (ref 65–99)
Potassium: 4 mmol/L (ref 3.5–5.3)
Sodium: 136 mmol/L (ref 135–146)
Total Bilirubin: 0.7 mg/dL (ref 0.2–1.2)
Total Protein: 7.2 g/dL (ref 6.1–8.1)

## 2019-10-04 LAB — LIPID PANEL
Cholesterol: 219 mg/dL — ABNORMAL HIGH (ref <200)
HDL: 46 mg/dL (ref >=40)
LDL Cholesterol (Calc): 138 mg/dL (calc) — ABNORMAL HIGH
Non-HDL Cholesterol (Calc): 173 mg/dL (calc) — ABNORMAL HIGH (ref <130)
Total CHOL/HDL Ratio: 4.8 (calc) (ref <5.0)
Triglycerides: 211 mg/dL — ABNORMAL HIGH (ref <150)

## 2019-10-04 LAB — CBC
HCT: 43.8 % (ref 38.5–50.0)
Hemoglobin: 14.8 g/dL (ref 13.2–17.1)
MCH: 30.5 pg (ref 27.0–33.0)
MCHC: 33.8 g/dL (ref 32.0–36.0)
MCV: 90.1 fL (ref 80.0–100.0)
MPV: 11.1 fL (ref 7.5–12.5)
Platelets: 240 10*3/uL (ref 140–400)
RBC: 4.86 10*6/uL (ref 4.20–5.80)
RDW: 12.5 % (ref 11.0–15.0)
WBC: 8.9 10*3/uL (ref 3.8–10.8)

## 2019-10-04 LAB — PSA: PSA: 1.2 ng/mL (ref <=4.0)

## 2019-10-06 NOTE — Telephone Encounter (Signed)
Started Orthovisc case waiting on Benefits Investigation Detail to see if a PA is needed. - CF

## 2019-10-13 NOTE — Telephone Encounter (Signed)
Don't forget to always check if other brands are covered/preferred if orthovisc isnt.

## 2019-10-13 NOTE — Telephone Encounter (Signed)
Orthovisc submitted and received BID stating product is not covered under the medical plan. - CF

## 2019-10-13 NOTE — Telephone Encounter (Signed)
Dr. Karie Schwalbe that is the information Orthovisc returned to me it does not state if any other brands are covered or preferred just that Orthovisc is not covered under the medical plan. - CF

## 2019-10-13 NOTE — Telephone Encounter (Signed)
If it gave you a specialty pharmacy I can just try to send off Synvisc which seems to be very well covered.

## 2019-10-17 MED ORDER — SYNVISC 16 MG/2ML IX SOSY: PREFILLED_SYRINGE | INTRA_ARTICULAR | 3 refills | Status: DC

## 2019-10-17 NOTE — Telephone Encounter (Signed)
It does not show a specialty pharmacy on the form his insurance is [employer] [place].  The only thing checked is not covered. Would you like for me to call Mr. Vanwart? - CF

## 2019-10-17 NOTE — Telephone Encounter (Signed)
I googled it and it looks like [facility].  I'll send off synvisc with all fingers and toes crossed, maybe eyes too.

## 2019-10-17 NOTE — Addendum Note (Signed)
Addended by: Monica Becton on: 10/17/2019 06:23 PM   Modules accepted: Orders

## 2019-10-19 ENCOUNTER — Other Ambulatory Visit: Payer: Self-pay | Admitting: Sports Medicine

## 2019-11-02 ENCOUNTER — Other Ambulatory Visit: Payer: Self-pay | Admitting: Sports Medicine

## 2019-11-02 MED ORDER — SYNVISC 16 MG/2ML IX SOSY: PREFILLED_SYRINGE | INTRA_ARTICULAR | 3 refills | Status: DC

## 2019-11-14 ENCOUNTER — Other Ambulatory Visit: Payer: Self-pay

## 2019-11-14 ENCOUNTER — Ambulatory Visit (INDEPENDENT_AMBULATORY_CARE_PROVIDER_SITE_OTHER): Payer: BC Managed Care – PPO | Admitting: Family Medicine

## 2019-11-14 ENCOUNTER — Other Ambulatory Visit: Payer: Self-pay | Admitting: Family Medicine

## 2019-11-14 ENCOUNTER — Encounter: Payer: Self-pay | Admitting: Family Medicine

## 2019-11-14 DIAGNOSIS — N2889 Other specified disorders of kidney and ureter: Secondary | ICD-10-CM | POA: Diagnosis not present

## 2019-11-14 DIAGNOSIS — I10 Essential (primary) hypertension: Secondary | ICD-10-CM | POA: Diagnosis not present

## 2019-11-14 DIAGNOSIS — E669 Obesity, unspecified: Secondary | ICD-10-CM

## 2019-11-14 NOTE — Assessment & Plan Note (Signed)
Blood pressure is at goal at for age and co-morbidities.  I recommend continuation of lisinopril and HCTZ at current strength.  In addition they were instructed to follow a low sodium diet with regular exercise to help to maintain adequate control of blood pressure.   

## 2019-11-14 NOTE — Assessment & Plan Note (Signed)
MRI ordered for follow up of renal lesion.  Encouraged to return call to imaging to schedule.

## 2019-11-14 NOTE — Assessment & Plan Note (Signed)
He has been referred to healthy weight and wellness clinic, encouraged to schedule appt.

## 2019-11-14 NOTE — Progress Notes (Signed)
Jerry Miller - 54 y.o. male MRN [id number]  Date of birth: January 01, 1966  Subjective Chief Complaint  Patient presents with  . Hypertension    HPI Jerry Miller is a 54 y.o. male here today for follow up of HTN.   He reports that he is doing well.  He still has not scheduled MRI to follow up renal lesion.    BP currently managed with lisinopril and hctz.   He was out of HCTZ at appt in July. He denies side effects from medication.  He has been working on weight loss and was referred to healthy weight [facility] wellness clinic, he has not returned their calls yet to set up an appointment.  He denies any symptoms related to HTN including chest pain, shortness of breath, palpitations, headache or vision change.   ROS:  A comprehensive ROS was completed and negative except as noted per HPI  No Known Allergies  Past Medical History:  Diagnosis Date  . Essential hypertension 10/10/2015  . Family history of prostate cancer 06/07/2012  . Fatigue   . Hyperlipidemia LDL goal <160 06/15/2012   [place] 2% April 2014   . Kidney calculus 05/08/2011  . Obesity (BMI 30.0-34.9)   . Psoriasis 05/01/2017  . 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 CHOLECYSTECTOMY  12/21/2018   [facility]  . KIDNEY STONE SURGERY    . KNEE ARTHROSCOPY W/ ACL RECONSTRUCTION AND PATELLA GRAFT      Social History   Socioeconomic History  . Marital status: Married    Spouse name: Not on file  . Number of children: Not on file  . Years of education: Not on file  . Highest education level: Not on file  Occupational History  . Not on file  Tobacco Use  . Smoking status: Never Smoker  . Smokeless tobacco: Never Used  Substance and Sexual Activity  . Alcohol use: Not on file  . Drug use: Not on file  . Sexual activity: Yes    Birth control/protection: Implant  Other Topics Concern  . Not on file  Social History Narrative  . Not on file   Social Determinants of  Health   Financial Resource Strain:   . Difficulty of Paying Living Expenses: Not on file  Food Insecurity:   . Worried About Programme researcher, broadcasting/film/video in the Last Year: Not on file  . Ran Out of Food in the Last Year: Not on file  Transportation Needs:   . Lack of Transportation (Medical): Not on file  . Lack of Transportation (Non-Medical): Not on file  Physical Activity:   . Days of Exercise per Week: Not on file  . Minutes of Exercise per Session: Not on file  Stress:   . Feeling of Stress : Not on file  Social Connections:   . Frequency of Communication with Friends and Family: Not on file  . Frequency of Social Gatherings with Friends and Family: Not on file  . Attends Religious Services: Not on file  . Active Member of Clubs or Organizations: Not on file  . Attends Banker Meetings: Not on file  . Marital Status: Not on file    Family History  Problem Relation Age of Onset  . Diabetes Mother   . Diabetes Father   . Heart failure Father   . Nephrolithiasis Sister     Health Maintenance  Topic Date Due  . Hepatitis C Screening  Never done  .  COLONOSCOPY  Never done  . INFLUENZA VACCINE  10/02/2019  . TETANUS/TDAP  05/07/2021  . COVID-19 Vaccine  Completed  . HIV Screening  Completed     ----------------------------------------------------------------------------------------------------------------------------------------------------------------------------------------------------------------- Physical Exam BP 130/84 (BP Location: Left Arm, Patient Position: Sitting, Cuff Size: Large)   Pulse 67   Ht 5' 8.11" (1.73 m)   Wt 242 lb 1.6 oz (109.8 kg)   SpO2 98%   BMI 36.69 kg/m   Physical Exam Constitutional:      Appearance: Normal appearance.  Cardiovascular:     Rate and Rhythm: Normal rate and regular rhythm.  Pulmonary:     Effort: Pulmonary effort is normal.     Breath sounds: Normal breath sounds.  Neurological:     General: No focal  deficit present.     Mental Status: He is alert and oriented to person, place, and time.  Psychiatric:        Mood and Affect: Mood normal.        Behavior: Behavior normal.     ------------------------------------------------------------------------------------------------------------------------------------------------------------------------------------------------------------------- Assessment and Plan  Essential hypertension Blood pressure is at goal at for age and co-morbidities.  I recommend continuation of lisinopril and HCTZ at current strength.  In addition they were instructed to follow a low sodium diet with regular exercise to help to maintain adequate control of blood pressure.    Obesity (BMI 30.0-34.9) He has been referred to healthy weight and wellness clinic, encouraged to schedule appt.   Renal mass MRI ordered for follow up of renal lesion.  Encouraged to return call to imaging to schedule.    No orders of the defined types were placed in this encounter.   Return in about 6 months (around 05/13/2020) for HTN.    This visit occurred during the SARS-CoV-2 public health emergency.  Safety protocols were in place, including screening questions prior to the visit, additional usage of staff PPE, and extensive cleaning of exam room while observing appropriate contact time as indicated for disinfecting solutions.

## 2019-11-14 NOTE — Patient Instructions (Signed)
Continue current medications.  You can contact imaging at [phone] to schedule MRI.  Schedule colonoscopy and with healthy weight and wellness.  See me again in 6 months.

## 2019-11-30 NOTE — Telephone Encounter (Signed)
[employer] required a PA and was denied the way the denial looks is the patient has a step therapy type plan. It states coverage is provider when the pateint has tried 3 formulary alternatives:   Euflexxa Monovisc [employer]isc  The original denial was for Orthovisc saying it was not covered so maybe we should try Euflexxa first?

## 2019-12-01 MED ORDER — EUFLEXXA 20 MG/2ML IX SOSY: PREFILLED_SYRINGE | INTRA_ARTICULAR | 0 refills | Status: DC

## 2019-12-01 NOTE — Telephone Encounter (Signed)
Happy to send in Euflexxa, sending it to [facility] right now.

## 2019-12-01 NOTE — Addendum Note (Signed)
Addended by: Monica Becton on: 12/01/2019 09:11 AM   Modules accepted: Orders

## 2019-12-06 ENCOUNTER — Encounter: Payer: Self-pay | Admitting: Family Medicine

## 2020-01-11 ENCOUNTER — Other Ambulatory Visit: Payer: Self-pay | Admitting: Sports Medicine

## 2020-01-11 DIAGNOSIS — M1731 Unilateral post-traumatic osteoarthritis, right knee: Secondary | ICD-10-CM

## 2020-05-14 ENCOUNTER — Other Ambulatory Visit: Payer: Self-pay

## 2020-05-14 ENCOUNTER — Encounter: Payer: Self-pay | Admitting: Family Medicine

## 2020-05-14 ENCOUNTER — Ambulatory Visit (INDEPENDENT_AMBULATORY_CARE_PROVIDER_SITE_OTHER): Payer: BC Managed Care – PPO | Admitting: Family Medicine

## 2020-05-14 ENCOUNTER — Ambulatory Visit (INDEPENDENT_AMBULATORY_CARE_PROVIDER_SITE_OTHER): Payer: BC Managed Care – PPO

## 2020-05-14 VITALS — BP 128/85 | HR 66 | Wt 243.5 lb

## 2020-05-14 DIAGNOSIS — R7303 Prediabetes: Secondary | ICD-10-CM

## 2020-05-14 DIAGNOSIS — E785 Hyperlipidemia, unspecified: Secondary | ICD-10-CM

## 2020-05-14 DIAGNOSIS — R7309 Other abnormal glucose: Secondary | ICD-10-CM | POA: Diagnosis not present

## 2020-05-14 DIAGNOSIS — M25521 Pain in right elbow: Secondary | ICD-10-CM

## 2020-05-14 DIAGNOSIS — I10 Essential (primary) hypertension: Secondary | ICD-10-CM | POA: Diagnosis not present

## 2020-05-14 DIAGNOSIS — E782 Mixed hyperlipidemia: Secondary | ICD-10-CM

## 2020-05-14 MED ORDER — HYDROCHLOROTHIAZIDE 25 MG PO TABS: 25.0000 mg | ORAL_TABLET | Freq: Every day | ORAL | 2 refills | Status: DC

## 2020-05-14 NOTE — Assessment & Plan Note (Signed)
Update a1c today . 

## 2020-05-14 NOTE — Assessment & Plan Note (Signed)
-   Recheck lipids today

## 2020-05-14 NOTE — Progress Notes (Signed)
Jerry Miller - 55 y.o. male MRN [id number]  Date of birth: Sep 14, 1965  Subjective Chief Complaint  Patient presents with  . Hypertension    HPI Jerry Miller is a 55 y.o. male here today for follow up of HTN and HLD.  He also has complaint of R elbow pain.    HTN currently treated with lisinopril and hctz. He is doing well with current medications, no side effects.  He has not had chest pain, shortness of breath, palpitations, headache or vision changes.    Cholesterol elevated on last set of labs as well.  Discussed lifestyle and dietary changes to help improve this however he has not made any significant changes.  He would be open to trying statin medication if needed.   Elbow pain present for several weeks.  Reports falling and hitting elbow.  Now has pain with pressure on R elbow.  Denies radiation of pain, numbness or tingling.   ROS:  A comprehensive ROS was completed and negative except as noted per HPI   No Known Allergies  Past Medical History:  Diagnosis Date  . Essential hypertension 10/10/2015  . Family history of prostate cancer 06/07/2012  . Fatigue   . Hyperlipidemia LDL goal <160 06/15/2012   [place] 2% April 2014   . Kidney calculus 05/08/2011  . Obesity (BMI 30.0-34.9)   . Psoriasis 05/01/2017  . 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 CHOLECYSTECTOMY  12/21/2018   [facility]  . KIDNEY STONE SURGERY    . KNEE ARTHROSCOPY W/ ACL RECONSTRUCTION AND PATELLA GRAFT      Social History   Socioeconomic History  . Marital status: Married    Spouse name: Not on file  . Number of children: Not on file  . Years of education: Not on file  . Highest education level: Not on file  Occupational History  . Not on file  Tobacco Use  . Smoking status: Never Smoker  . Smokeless tobacco: Never Used  Substance and Sexual Activity  . Alcohol use: Not on file  . Drug use: Not on file  . Sexual activity: Yes     Birth control/protection: Implant  Other Topics Concern  . Not on file  Social History Narrative  . Not on file   Social Determinants of Health   Financial Resource Strain: Not on file  Food Insecurity: Not on file  Transportation Needs: Not on file  Physical Activity: Not on file  Stress: Not on file  Social Connections: Not on file    Family History  Problem Relation Age of Onset  . Diabetes Mother   . Diabetes Father   . Heart failure Father   . Nephrolithiasis Sister     Health Maintenance  Topic Date Due  . Hepatitis C Screening  Never done  . COLONOSCOPY (Pts 45-61yrs Insurance coverage will need to be confirmed)  Never done  . INFLUENZA VACCINE  10/02/2019  . COVID-19 Vaccine (3 - Booster for Moderna series) 01/30/2020  . TETANUS/TDAP  05/07/2021  . HIV Screening  Completed  . HPV VACCINES  Aged Out     ----------------------------------------------------------------------------------------------------------------------------------------------------------------------------------------------------------------- Physical Exam BP 128/85 (BP Location: Left Arm, Patient Position: Sitting, Cuff Size: Normal)   Pulse 66   Wt 243 lb 8 oz (110.5 kg)   SpO2 100%   BMI 36.90 kg/m   Physical Exam Constitutional:      Appearance: Normal appearance.  HENT:     Head: Normocephalic  and atraumatic.  Eyes:     General: No scleral icterus. Cardiovascular:     Rate and Rhythm: Normal rate and regular rhythm.  Pulmonary:     Effort: Pulmonary effort is normal.     Breath sounds: Normal breath sounds.  Musculoskeletal:     Cervical back: Neck supple.  Neurological:     General: No focal deficit present.     Mental Status: He is alert.  Psychiatric:        Mood and Affect: Mood normal.        Behavior: Behavior normal.      ------------------------------------------------------------------------------------------------------------------------------------------------------------------------------------------------------------------- Assessment and Plan  Essential hypertension Blood pressure is at goal at for age and co-morbidities.  I recommend continuation of lisionpril and hctz at current strength.  In addition they were instructed to follow a low sodium diet with regular exercise to help to maintain adequate control of blood pressure.    Hyperlipidemia LDL goal <160 Recheck lipids today.   Prediabetes Update a1c today.   Right elbow pain TTP along olecranon.  Imaging ordered.    Meds ordered this encounter  Medications  . hydrochlorothiazide (HYDRODIURIL) 25 MG tablet    Sig: Take 1 tablet (25 mg total) by mouth daily.    Dispense:  90 tablet    Refill:  2    No follow-ups on file.    This visit occurred during the SARS-CoV-2 public health emergency.  Safety protocols were in place, including screening questions prior to the visit, additional usage of staff PPE, and extensive cleaning of exam room while observing appropriate contact time as indicated for disinfecting solutions.

## 2020-05-14 NOTE — Assessment & Plan Note (Signed)
Blood pressure is at goal at for age and co-morbidities.  I recommend continuation of lisionpril and hctz at current strength.  In addition they were instructed to follow a low sodium diet with regular exercise to help to maintain adequate control of blood pressure.

## 2020-05-14 NOTE — Patient Instructions (Signed)
Great to see you! Have xrays and labs completed.  Continue current medications.  See me again in about 6 months.

## 2020-05-14 NOTE — Assessment & Plan Note (Signed)
TTP along olecranon.  Imaging ordered.

## 2020-05-15 DIAGNOSIS — M778 Other enthesopathies, not elsewhere classified: Secondary | ICD-10-CM | POA: Diagnosis not present

## 2020-05-15 DIAGNOSIS — M25521 Pain in right elbow: Secondary | ICD-10-CM | POA: Diagnosis not present

## 2020-05-15 LAB — LIPID PANEL W/REFLEX DIRECT LDL
Cholesterol: 180 mg/dL (ref <200)
HDL: 39 mg/dL — ABNORMAL LOW (ref >=40)
LDL Cholesterol (Calc): 101 mg/dL (calc) — ABNORMAL HIGH
Non-HDL Cholesterol (Calc): 141 mg/dL (calc) — ABNORMAL HIGH (ref <130)
Total CHOL/HDL Ratio: 4.6 (calc) (ref <5.0)
Triglycerides: 279 mg/dL — ABNORMAL HIGH (ref <150)

## 2020-05-15 LAB — HEMOGLOBIN A1C
Hgb A1c MFr Bld: 5.9 % of total Hgb — ABNORMAL HIGH (ref <5.7)
Mean Plasma Glucose: 123 mg/dL
eAG (mmol/L): 6.8 mmol/L

## 2020-05-19 ENCOUNTER — Encounter: Payer: Self-pay | Admitting: Family Medicine

## 2020-09-23 ENCOUNTER — Other Ambulatory Visit: Payer: Self-pay | Admitting: Family Medicine

## 2020-09-23 DIAGNOSIS — K2100 Gastro-esophageal reflux disease with esophagitis, without bleeding: Secondary | ICD-10-CM

## 2020-10-31 ENCOUNTER — Encounter: Payer: Self-pay | Admitting: Family Medicine

## 2020-11-18 ENCOUNTER — Other Ambulatory Visit: Payer: Self-pay | Admitting: Family Medicine

## 2020-11-19 ENCOUNTER — Other Ambulatory Visit: Payer: Self-pay

## 2020-11-19 ENCOUNTER — Encounter: Payer: Self-pay | Admitting: Family Medicine

## 2020-11-19 ENCOUNTER — Ambulatory Visit (INDEPENDENT_AMBULATORY_CARE_PROVIDER_SITE_OTHER): Payer: BC Managed Care – PPO | Admitting: Family Medicine

## 2020-11-19 VITALS — BP 138/89 | HR 64 | Temp 97.6°F | Ht 68.0 in | Wt 247.0 lb

## 2020-11-19 DIAGNOSIS — E785 Hyperlipidemia, unspecified: Secondary | ICD-10-CM

## 2020-11-19 DIAGNOSIS — R7303 Prediabetes: Secondary | ICD-10-CM

## 2020-11-19 DIAGNOSIS — Z1211 Encounter for screening for malignant neoplasm of colon: Secondary | ICD-10-CM | POA: Diagnosis not present

## 2020-11-19 DIAGNOSIS — I10 Essential (primary) hypertension: Secondary | ICD-10-CM | POA: Diagnosis not present

## 2020-11-19 DIAGNOSIS — K429 Umbilical hernia without obstruction or gangrene: Secondary | ICD-10-CM | POA: Insufficient documentation

## 2020-11-19 MED ORDER — LISINOPRIL 10 MG PO TABS: 10.0000 mg | ORAL_TABLET | Freq: Every day | ORAL | 3 refills | Status: DC

## 2020-11-19 MED ORDER — RYBELSUS 3 MG PO TABS: ORAL_TABLET | ORAL | 0 refills | Status: DC

## 2020-11-19 MED ORDER — RYBELSUS 7 MG PO TABS: ORAL_TABLET | ORAL | 2 refills | Status: DC

## 2020-11-19 MED ORDER — HYDROCHLOROTHIAZIDE 25 MG PO TABS: 25.0000 mg | ORAL_TABLET | Freq: Every day | ORAL | 3 refills | Status: DC

## 2020-11-19 NOTE — Assessment & Plan Note (Signed)
Blood pressure remains fairly well controlled.  He will continue current medications.  Prescriptions renewed.  Due for updated labs which were ordered today.

## 2020-11-19 NOTE — Assessment & Plan Note (Signed)
Update lipid panel.  

## 2020-11-19 NOTE — Assessment & Plan Note (Signed)
Easily reducible umbilical hernia noted.  He would like to hold off on referral for surgery at this time.  Red flags discussed.

## 2020-11-19 NOTE — Progress Notes (Signed)
Jerry Miller - 55 y.o. male MRN [id number]  Date of birth: October 28, 1965  Subjective Chief Complaint  Patient presents with   Hypertension    HPI Jerry Miller is a 55 year old male here today for follow-up visit.  He continues on lisinopril and hydrochlorothiazide for treatment of hypertension.  He is doing well with this and denies side effects related to medication.  He has not had chest pain, shortness of breath, palpitations, headache or vision changes.  He does have history of prediabetes.  He has had some difficulty managing his weight.  Weight is up about 4 pounds since last visit.  He would be interested in trying medication to help with weight loss.  He does plan to make changes to diet and activity level.  Thinks he may have an umbilical hernia.  Denies pain but has protrusion around umbilicus when standing.  This improves with laying down.  Bowels are moving normally.  He is due for colon cancer screening.  Has had some issues with hemorrhoids.  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    Past Surgical History:  Procedure Laterality Date   CHOLECYSTECTOMY  12/21/2018   [facility]   KIDNEY STONE SURGERY     KNEE ARTHROSCOPY W/ ACL RECONSTRUCTION AND PATELLA GRAFT      Social History   Socioeconomic History   Marital status: Married    Spouse name: Not on file   Number of children: Not on file   Years of education: Not on file   Highest education level: Not on file  Occupational History   Not on file  Tobacco Use   Smoking status: Never   Smokeless tobacco: Never  Substance and Sexual Activity    Alcohol use: Not on file   Drug use: Not on file   Sexual activity: Yes    Birth control/protection: Implant  Other Topics Concern   Not on file  Social History Narrative   Not on file   Social Determinants of Health   Financial Resource Strain: Not on file  Food Insecurity: Not on file  Transportation Needs: Not on file  Physical Activity: Not on file  Stress: Not on file  Social Connections: Not on file    Family History  Problem Relation Age of Onset   Diabetes Mother    Diabetes Father    Heart failure Father    Nephrolithiasis Sister     Health Maintenance  Topic Date Due   Hepatitis C Screening  Never done   COLONOSCOPY (Pts 45-9yrs Insurance coverage will need to be confirmed)  Never done   Zoster Vaccines- Shingrix (1 of 2) Never done   COVID-19 Vaccine (3 - Booster for Moderna series) 12/30/2019   INFLUENZA VACCINE  10/01/2020   TETANUS/TDAP  05/07/2021   HIV Screening  Completed   HPV VACCINES  Aged Out     ----------------------------------------------------------------------------------------------------------------------------------------------------------------------------------------------------------------- Physical Exam BP 138/89 (BP Location: Left Arm, Patient Position: Sitting, Cuff Size: Large)   Pulse 64   Temp 97.6 F (36.4 C)   Ht 5\' 8"  (1.727 m)   Wt 247 lb (112  kg)   SpO2 99%   BMI 37.56 kg/m   Physical Exam Constitutional:      Appearance: Normal appearance.  HENT:     Head: Normocephalic and atraumatic.  Eyes:     General: No scleral icterus. Cardiovascular:     Rate and Rhythm: Normal rate and regular rhythm.  Pulmonary:     Effort: Pulmonary effort is normal.     Breath sounds: Normal breath sounds.  Abdominal:     Hernia: A hernia (Umbilical) is present.  Musculoskeletal:     Cervical back: Neck supple.  Neurological:     General: No focal deficit present.     Mental Status: He is alert.  Psychiatric:        Mood  and Affect: Mood normal.        Behavior: Behavior normal.    ------------------------------------------------------------------------------------------------------------------------------------------------------------------------------------------------------------------- Assessment and Plan  Essential hypertension Blood pressure remains fairly well controlled.  He will continue current medications.  Prescriptions renewed.  Due for updated labs which were ordered today.  Prediabetes Updating A1c.  He continues to have difficulty with weight loss.  We will add Rybelsus 3 mg for the first month with increase to 7 mg thereafter to help with blood sugar and weight management.  Hyperlipidemia LDL goal <160 Update lipid panel.  Umbilical hernia Easily reducible umbilical hernia noted.  He would like to hold off on referral for surgery at this time.  Red flags discussed.   Meds ordered this encounter  Medications   Semaglutide (RYBELSUS) 3 MG TABS    Sig: Take 3mg  PO daily x30 days then increase to 7mg     Dispense:  30 tablet    Refill:  0   Semaglutide (RYBELSUS) 7 MG TABS    Sig: Take 1 tab PO daily.  Start after completion of 3mg  dose.    Dispense:  30 tablet    Refill:  2   lisinopril (ZESTRIL) 10 MG tablet    Sig: Take 1 tablet (10 mg total) by mouth daily.    Dispense:  90 tablet    Refill:  3   hydrochlorothiazide (HYDRODIURIL) 25 MG tablet    Sig: Take 1 tablet (25 mg total) by mouth daily.    Dispense:  90 tablet    Refill:  3    Return in about 6 months (around 05/19/2021) for HTN.    This visit occurred during the SARS-CoV-2 public health emergency.  Safety protocols were in place, including screening questions prior to the visit, additional usage of staff PPE, and extensive cleaning of exam room while observing appropriate contact time as indicated for disinfecting solutions.

## 2020-11-19 NOTE — Assessment & Plan Note (Signed)
Updating A1c.  He continues to have difficulty with weight loss.  We will add Rybelsus 3 mg for the first month with increase to 7 mg thereafter to help with blood sugar and weight management.

## 2020-11-19 NOTE — Patient Instructions (Signed)

## 2020-12-14 ENCOUNTER — Telehealth: Payer: Self-pay

## 2020-12-14 NOTE — Telephone Encounter (Signed)
Medication: Semaglutide (RYBELSUS) 7 MG TABS Prior authorization submitted via CoverMyMeds on 12/14/2020 PA submission pending

## 2020-12-14 NOTE — Telephone Encounter (Signed)
Medication: Semaglutide (RYBELSUS) 3 MG TABS Prior authorization submitted via CoverMyMeds on 12/14/2020 PA submission pending

## 2020-12-19 NOTE — Telephone Encounter (Signed)
Medication: Semaglutide (RYBELSUS) 7 MG TABS Prior authorization determination received Medication has been denied Reason for denial:  "Coverage is provided for the diagnosis of diabetes mellitus type 2. Coverage cannot be authorized at this time. "

## 2020-12-28 NOTE — Telephone Encounter (Signed)
Medication: Semaglutide (RYBELSUS) 7 MG TABS Prior authorization submitted with new info Prior authorization submitted via CoverMyMeds on 12/28/2020 PA submission pending

## 2021-01-04 NOTE — Telephone Encounter (Signed)
Medication: Semaglutide (RYBELSUS) 7 MG TABS Spoke with Carla at [employer] and she said that the PA is still under review and that the clinical team has up to 14 days to review the case. At this time, she said a determination should be made by 01/12/21.  PA submission pending

## 2021-01-11 ENCOUNTER — Telehealth: Payer: Self-pay

## 2021-01-11 NOTE — Telephone Encounter (Signed)
Medication: Semaglutide (RYBELSUS) 3 MG TABS Prior authorization submitted via CoverMyMeds on 01/11/2021 PA submission pending

## 2021-01-31 NOTE — Telephone Encounter (Signed)
Medication: Semaglutide (RYBELSUS) 3 MG TABS Prior authorization determination received on 01/23/2021 Medication has been denied Reason for denial:  "Coverage is provided for the diagnosis of diabetes mellitus type 2."

## 2021-02-01 NOTE — Telephone Encounter (Signed)
Please contact pt to have fasting lab visit. Needed to justify Rybelsus medication.  Thanks

## 2021-02-01 NOTE — Telephone Encounter (Signed)
LVM for patient letting him know he is due for fasting labs.

## 2021-05-27 ENCOUNTER — Encounter: Payer: Self-pay | Admitting: Family Medicine

## 2021-05-27 ENCOUNTER — Other Ambulatory Visit: Payer: Self-pay

## 2021-05-27 ENCOUNTER — Ambulatory Visit (INDEPENDENT_AMBULATORY_CARE_PROVIDER_SITE_OTHER): Payer: BC Managed Care – PPO | Admitting: Family Medicine

## 2021-05-27 VITALS — BP 125/85 | HR 62 | Ht 68.0 in | Wt 250.0 lb

## 2021-05-27 DIAGNOSIS — E785 Hyperlipidemia, unspecified: Secondary | ICD-10-CM

## 2021-05-27 DIAGNOSIS — Z1211 Encounter for screening for malignant neoplasm of colon: Secondary | ICD-10-CM | POA: Diagnosis not present

## 2021-05-27 DIAGNOSIS — I10 Essential (primary) hypertension: Secondary | ICD-10-CM

## 2021-05-27 DIAGNOSIS — R7303 Prediabetes: Secondary | ICD-10-CM | POA: Diagnosis not present

## 2021-05-27 DIAGNOSIS — Z23 Encounter for immunization: Secondary | ICD-10-CM | POA: Diagnosis not present

## 2021-05-27 DIAGNOSIS — M1731 Unilateral post-traumatic osteoarthritis, right knee: Secondary | ICD-10-CM

## 2021-05-27 MED ORDER — RYBELSUS 3 MG PO TABS: ORAL_TABLET | ORAL | 0 refills | Status: DC

## 2021-05-27 MED ORDER — RYBELSUS 7 MG PO TABS: ORAL_TABLET | ORAL | 2 refills | Status: DC

## 2021-05-27 NOTE — Assessment & Plan Note (Signed)
Updating lipid panel today. 

## 2021-05-27 NOTE — Assessment & Plan Note (Signed)
Updated A1c ordered.  We will see if we get Rybelsus approved for him to help with weight management as well as blood sugar control. ?

## 2021-05-27 NOTE — Assessment & Plan Note (Signed)
Blood pressure mains well controlled.  Recommend continuation of current medications for management of hypertension.  Low-sodium diet and weight loss.  Follow-up in 6 months. ?

## 2021-05-27 NOTE — Assessment & Plan Note (Signed)
Continues on meloxicam as needed.  Like to have repeat injection before traveling in July.  He will call and let [place] know few weeks before he travels. ?

## 2021-05-27 NOTE — Progress Notes (Signed)
?Jerry Miller - 56 y.o. male MRN [id number]  Date of birth: 09/19/1965 ? ?Subjective ?Chief Complaint  ?Patient presents with  ? Hypertension  ? ? ?HPI ?Jerry Miller is a 56 year old male here today for follow-up.  Reports overall he is doing well.  Blood pressure remains well controlled with lisinopril and hydrochlorothiazide.  He does remain concerned about his weight.  Admits he could do better with activity.  We have prescribed Rybelsus previously due to his history of prediabetes as well as to help with weight management however he never had labs completed to go along with his prior authorization.  He is compared to have labs completed today.  He would still like to try Rybelsus. ? ?He does continue to have problems with his right knee.  He has had injections in the knee before and would like to have another before he travels in July.  He will call to make a future appointment for this. ? ?ROS:  A comprehensive ROS was completed and negative except as noted per HPI ? ?No Known Allergies ? ?Past Medical History:  ?Diagnosis Date  ? Essential hypertension 10/10/2015  ? Family history of prostate cancer 06/07/2012  ? Fatigue   ? Hyperlipidemia LDL goal <160 06/15/2012  ? [place] 2% April 2014   ? Kidney calculus 05/08/2011  ? Obesity (BMI 30.0-34.9)   ? Psoriasis 05/01/2017  ? 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 ? ?Past Surgical History:  ?Procedure Laterality Date  ? CHOLECYSTECTOMY  12/21/2018  ? [facility]  ? KIDNEY STONE SURGERY    ? KNEE ARTHROSCOPY W/ ACL RECONSTRUCTION AND PATELLA GRAFT    ? ? ?Social History  ? ?Socioeconomic History  ? Marital status: Married  ?  Spouse name: Not on file  ? Number of children: Not on file  ? Years of education: Not on file  ? Highest education level: Not on file  ?Occupational History  ? Not on file  ?Tobacco Use  ? Smoking status: Never  ? Smokeless tobacco: Never  ?Substance and Sexual Activity  ? Alcohol use: Not on file  ? Drug use: Not on file  ? Sexual activity: Yes  ?   Birth control/protection: Implant  ?Other Topics Concern  ? Not on file  ?Social History Narrative  ? Not on file  ? ?Social Determinants of Health  ? ?Financial Resource Strain: Not on file  ?Food Insecurity: Not on file  ?Transportation Needs: Not on file  ?Physical Activity: Not on file  ?Stress: Not on file  ?Social Connections: Not on file  ? ? ?Family History  ?Problem Relation Age of Onset  ? Diabetes Mother   ? Diabetes Father   ? Heart failure Father   ? Nephrolithiasis Sister   ? ? ?Health Maintenance  ?Topic Date Due  ? INFLUENZA VACCINE  11/01/2021 (Originally 10/01/2020)  ? COVID-19 Vaccine (3 - Booster for Moderna series) 11/01/2021 (Originally 09/24/2019)  ? Zoster Vaccines- Shingrix (1 of 2) 11/01/2021 (Originally 01/23/2018)  ? COLONOSCOPY (Pts 45-86yrs Insurance coverage will need to be confirmed)  05/28/2022 (Originally 01/23/2013)  ? Hepatitis C Screening  05/28/2022 (Originally 01/23/1986)  ? TETANUS/TDAP  05/28/2031  ? HIV Screening  Completed  ? HPV VACCINES  Aged Out  ? ? ? ?----------------------------------------------------------------------------------------------------------------------------------------------------------------------------------------------------------------- ?Physical Exam ?BP 125/85 (BP Location: Left Arm, Patient Position: Sitting, Cuff Size: Large)   Pulse 62   Ht 5\' 8"  (1.727 m)   Wt 250 lb (113.4 kg)   SpO2 99%   BMI 38.01 kg/m?  ? ?  Physical Exam ?Constitutional:   ?   Appearance: Normal appearance.  ?Eyes:  ?   General: No scleral icterus. ?Cardiovascular:  ?   Rate and Rhythm: Normal rate and regular rhythm.  ?Pulmonary:  ?   Effort: Pulmonary effort is normal.  ?   Breath sounds: Normal breath sounds.  ?Musculoskeletal:  ?   Cervical back: Neck supple.  ?Neurological:  ?   General: No focal deficit present.  ?   Mental Status: He is alert.  ?Psychiatric:     ?   Mood and Affect: Mood normal.     ?   Behavior: Behavior normal.   ? ? ?------------------------------------------------------------------------------------------------------------------------------------------------------------------------------------------------------------------- ?Assessment and Plan ? ?Essential hypertension ?Blood pressure mains well controlled.  Recommend continuation of current medications for management of hypertension.  Low-sodium diet and weight loss.  Follow-up in 6 months. ? ?Post-traumatic osteoarthritis of right knee ?Continues on meloxicam as needed.  Like to have repeat injection before traveling in July.  He will call and let [place] know few weeks before he travels. ? ?Hyperlipidemia LDL goal <160 ?Updating lipid panel today. ? ?Prediabetes ?Updated A1c ordered.  We will see if we get Rybelsus approved for him to help with weight management as well as blood sugar control. ? ? ?Meds ordered this encounter  ?Medications  ? Semaglutide (RYBELSUS) 3 MG TABS  ?  Sig: Take 3mg  PO daily x30 days then increase to 7mg   ?  Dispense:  30 tablet  ?  Refill:  0  ? Semaglutide (RYBELSUS) 7 MG TABS  ?  Sig: Take 1 tab PO daily.  Start after completion of 3mg  dose.  ?  Dispense:  30 tablet  ?  Refill:  2  ? ? ?Return in about 6 months (around 11/27/2021) for HTN/Prediabetes. ? ? ? ?This visit occurred during the SARS-CoV-2 public health emergency.  Safety protocols were in place, including screening questions prior to the visit, additional usage of staff PPE, and extensive cleaning of exam room while observing appropriate contact time as indicated for disinfecting solutions.  ? ?

## 2021-05-28 LAB — CBC WITH DIFFERENTIAL/PLATELET
Absolute Monocytes: 555 cells/uL (ref 200–950)
Basophils Absolute: 53 cells/uL (ref 0–200)
Basophils Relative: 0.7 %
Eosinophils Absolute: 143 cells/uL (ref 15–500)
Eosinophils Relative: 1.9 %
HCT: 44.9 % (ref 38.5–50.0)
Hemoglobin: 15 g/dL (ref 13.2–17.1)
Lymphs Abs: 1853 cells/uL (ref 850–3900)
MCH: 30.2 pg (ref 27.0–33.0)
MCHC: 33.4 g/dL (ref 32.0–36.0)
MCV: 90.3 fL (ref 80.0–100.0)
MPV: 11.5 fL (ref 7.5–12.5)
Monocytes Relative: 7.4 %
Neutro Abs: 4898 cells/uL (ref 1500–7800)
Neutrophils Relative %: 65.3 %
Platelets: 263 10*3/uL (ref 140–400)
RBC: 4.97 10*6/uL (ref 4.20–5.80)
RDW: 12.9 % (ref 11.0–15.0)
Total Lymphocyte: 24.7 %
WBC: 7.5 10*3/uL (ref 3.8–10.8)

## 2021-05-28 LAB — LIPID PANEL W/REFLEX DIRECT LDL
Cholesterol: 208 mg/dL — ABNORMAL HIGH (ref <200)
HDL: 39 mg/dL — ABNORMAL LOW (ref >=40)
LDL Cholesterol (Calc): 134 mg/dL (calc) — ABNORMAL HIGH
Non-HDL Cholesterol (Calc): 169 mg/dL (calc) — ABNORMAL HIGH (ref <130)
Total CHOL/HDL Ratio: 5.3 (calc) — ABNORMAL HIGH (ref <5.0)
Triglycerides: 209 mg/dL — ABNORMAL HIGH (ref <150)

## 2021-05-28 LAB — COMPLETE METABOLIC PANEL WITH GFR
AG Ratio: 1.6 (calc) (ref 1.0–2.5)
ALT: 41 U/L (ref 9–46)
AST: 23 U/L (ref 10–35)
Albumin: 4.5 g/dL (ref 3.6–5.1)
Alkaline phosphatase (APISO): 60 U/L (ref 35–144)
BUN: 19 mg/dL (ref 7–25)
CO2: 25 mmol/L (ref 20–32)
Calcium: 9.6 mg/dL (ref 8.6–10.3)
Chloride: 103 mmol/L (ref 98–110)
Creat: 0.95 mg/dL (ref 0.70–1.30)
Globulin: 2.8 g/dL (calc) (ref 1.9–3.7)
Glucose, Bld: 123 mg/dL — ABNORMAL HIGH (ref 65–99)
Potassium: 4 mmol/L (ref 3.5–5.3)
Sodium: 139 mmol/L (ref 135–146)
Total Bilirubin: 0.6 mg/dL (ref 0.2–1.2)
Total Protein: 7.3 g/dL (ref 6.1–8.1)
eGFR: 96 mL/min/{1.73_m2} (ref >=60)

## 2021-05-28 LAB — HEMOGLOBIN A1C
Hgb A1c MFr Bld: 6.9 % of total Hgb — ABNORMAL HIGH (ref <5.7)
Mean Plasma Glucose: 151 mg/dL
eAG (mmol/L): 8.4 mmol/L

## 2021-07-18 ENCOUNTER — Other Ambulatory Visit: Payer: Self-pay | Admitting: Family Medicine

## 2021-07-18 DIAGNOSIS — K2100 Gastro-esophageal reflux disease with esophagitis, without bleeding: Secondary | ICD-10-CM

## 2021-07-18 DIAGNOSIS — I10 Essential (primary) hypertension: Secondary | ICD-10-CM

## 2021-08-21 ENCOUNTER — Ambulatory Visit (INDEPENDENT_AMBULATORY_CARE_PROVIDER_SITE_OTHER): Payer: BC Managed Care – PPO

## 2021-08-21 ENCOUNTER — Ambulatory Visit (INDEPENDENT_AMBULATORY_CARE_PROVIDER_SITE_OTHER): Payer: BC Managed Care – PPO | Admitting: Sports Medicine

## 2021-08-21 DIAGNOSIS — M1731 Unilateral post-traumatic osteoarthritis, right knee: Secondary | ICD-10-CM | POA: Diagnosis not present

## 2021-08-21 DIAGNOSIS — E119 Type 2 diabetes mellitus without complications: Secondary | ICD-10-CM

## 2021-08-21 MED ORDER — ONDANSETRON 8 MG PO TBDP: 8.0000 mg | ORAL_TABLET | Freq: Three times a day (TID) | ORAL | 3 refills | Status: AC | PRN

## 2021-08-21 NOTE — Progress Notes (Signed)
    Procedures performed today:    Procedure: Real-time Ultrasound Guided injection of the right knee Device: Samsung HS60  Verbal informed consent obtained.  Time-out conducted.  Noted no overlying erythema, induration, or other signs of local infection.  Skin prepped in a sterile fashion.  Local anesthesia: Topical Ethyl chloride.  With sterile technique and under real time ultrasound guidance: Arthritic knee, mild effusion noted, 1 cc Kenalog 40, 2 cc lidocaine, 2 cc bupivacaine injected easily Completed without difficulty  Advised to call if fevers/chills, erythema, induration, drainage, or persistent bleeding.  Images permanently stored and available for review in PACS.  Impression: Technically successful ultrasound guided injection.  Independent interpretation of notes and tests performed by another provider:   None.  Brief History, Exam, Impression, and Recommendations:    Post-traumatic osteoarthritis of right knee Meloxicam not working as it should, traveling next month, increasing pain, last injected a couple years ago, repeat injection today. Return as needed. We could certainly try approval for viscosupplementation again if this fails.  Controlled type 2 diabetes mellitus without complication, without long-term current use of insulin (HCC) Jerry Miller has done a fantastic job with Rybelsus, excellent weight loss, we did suggest maximizing the efficacy of Rybelsus with titrating up to 14 mg, I will add some Zofran and he can follow-up with his primary care provider for discussion of the increase to 14 mg.  Chronic process with exacerbation of pharmacologic intervention  ___________________________________________ Ihor Austin. Benjamin Stain, M.D., ABFM., CAQSM. Primary Care and Sports Medicine [facility]  Adjunct Instructor of Family Medicine  [employer]

## 2021-08-21 NOTE — Assessment & Plan Note (Signed)
Jerry Miller has done a fantastic job with Rybelsus, excellent weight loss, we did suggest maximizing the efficacy of Rybelsus with titrating up to 14 mg, I will add some Zofran and he can follow-up with his primary care provider for discussion of the increase to 14 mg.

## 2021-08-21 NOTE — Assessment & Plan Note (Signed)
Meloxicam not working as it should, traveling next month, increasing pain, last injected a couple years ago, repeat injection today. Return as needed. We could certainly try approval for viscosupplementation again if this fails.

## 2021-09-08 ENCOUNTER — Encounter: Payer: Self-pay | Admitting: Family Medicine

## 2021-09-09 MED ORDER — RYBELSUS 14 MG PO TABS: 14.0000 mg | ORAL_TABLET | Freq: Every day | ORAL | 3 refills | Status: DC

## 2021-09-09 NOTE — Telephone Encounter (Signed)
14mg  sent in.

## 2021-09-19 IMAGING — DX DG ELBOW COMPLETE 3+V*R*
4 series · 4 of 4 positions shown · non-contrast
Comparison: None.

CLINICAL DATA: Right elbow pain status post fall

EXAM:
RIGHT ELBOW - COMPLETE 3+ VIEW

[elbow ap]
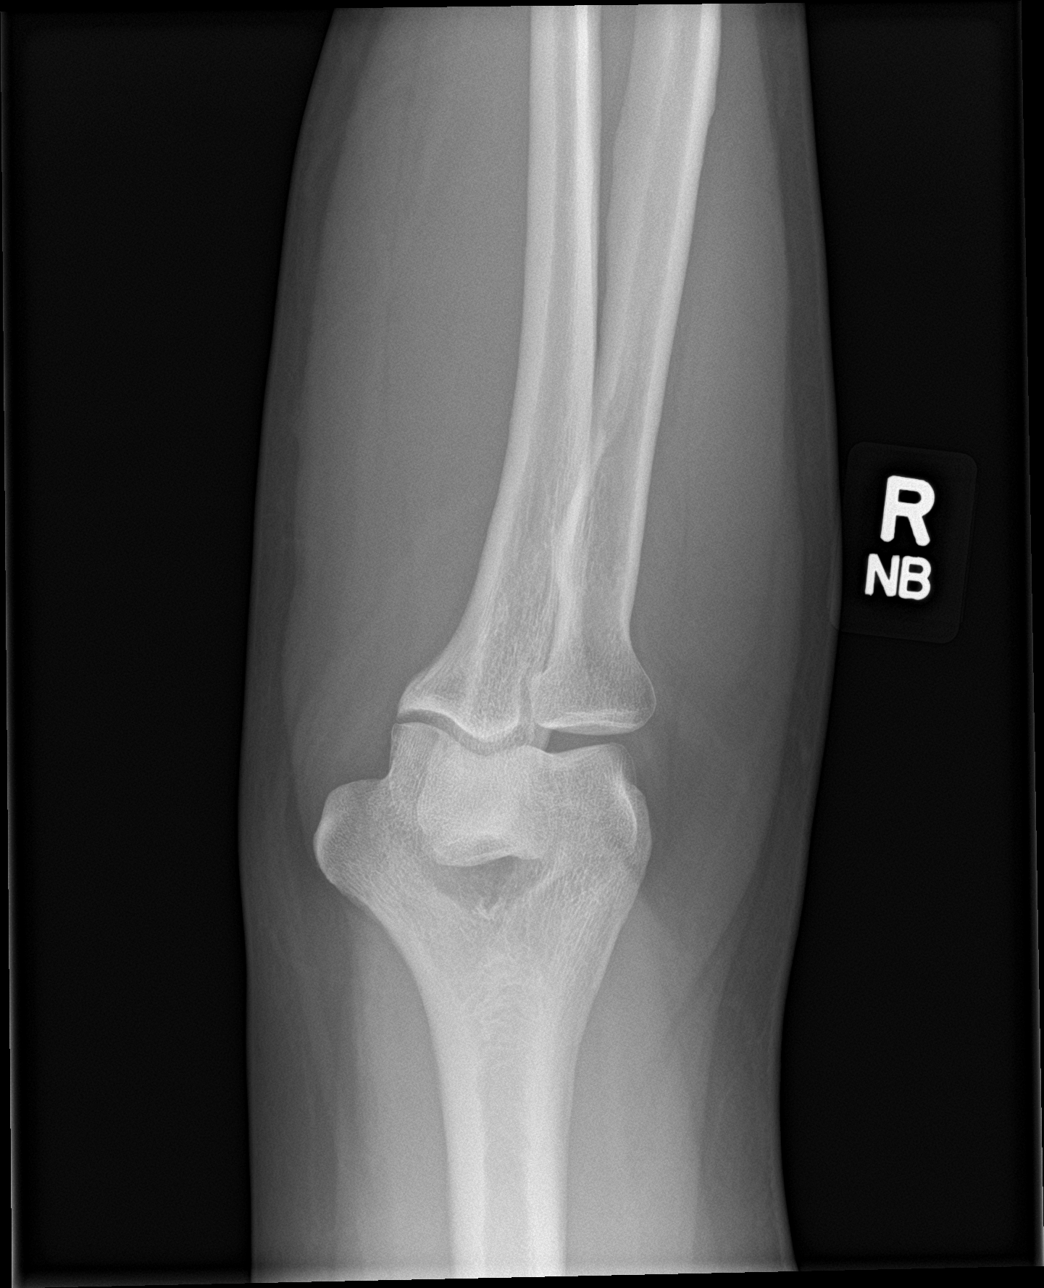

[elbow obl (1 of 2)]
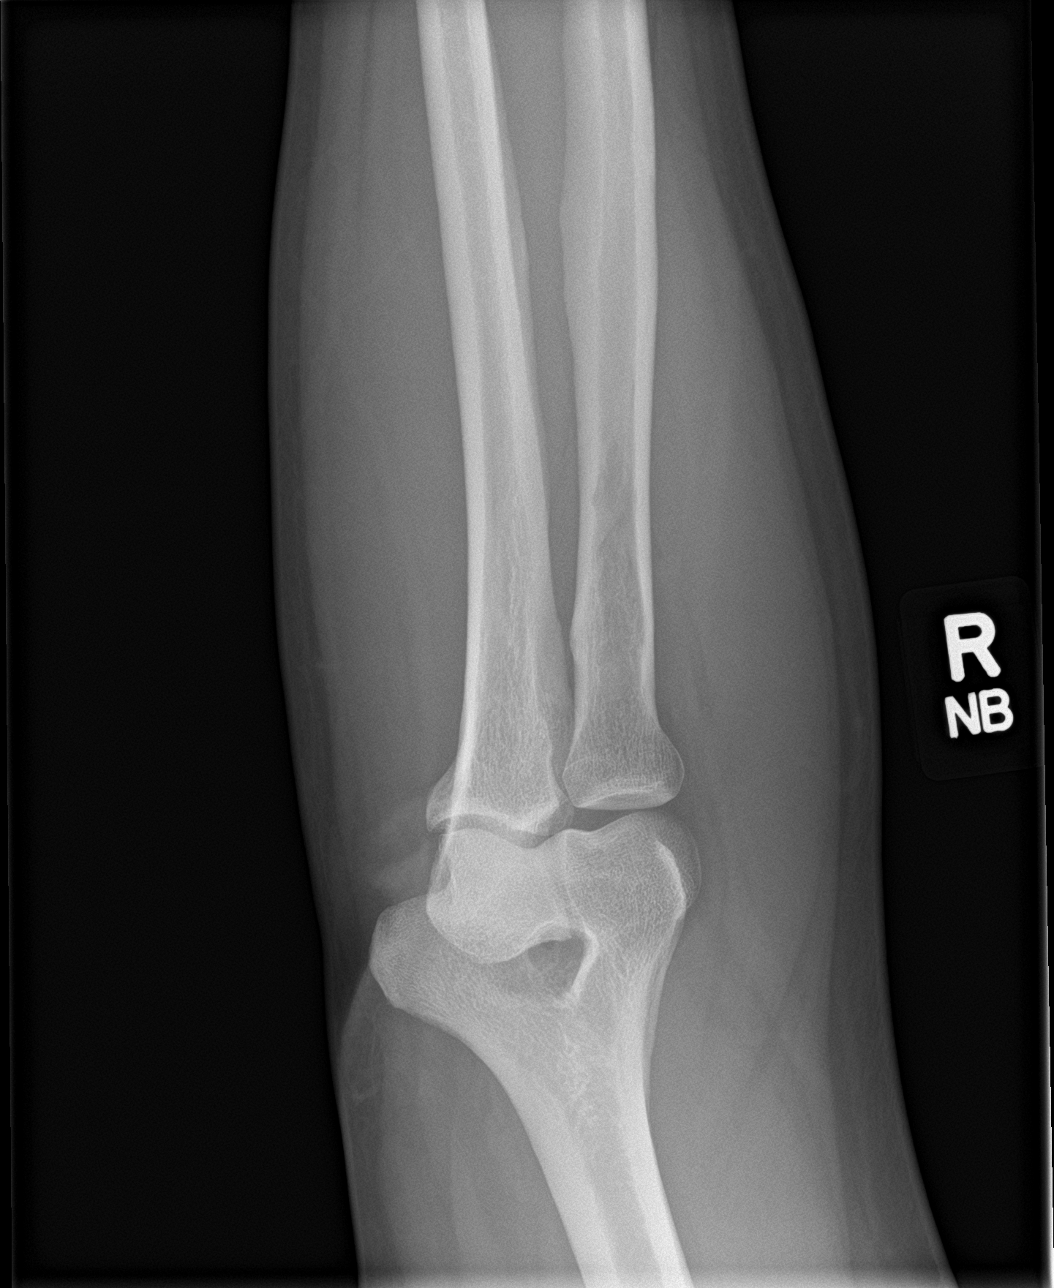

[elbow lat]
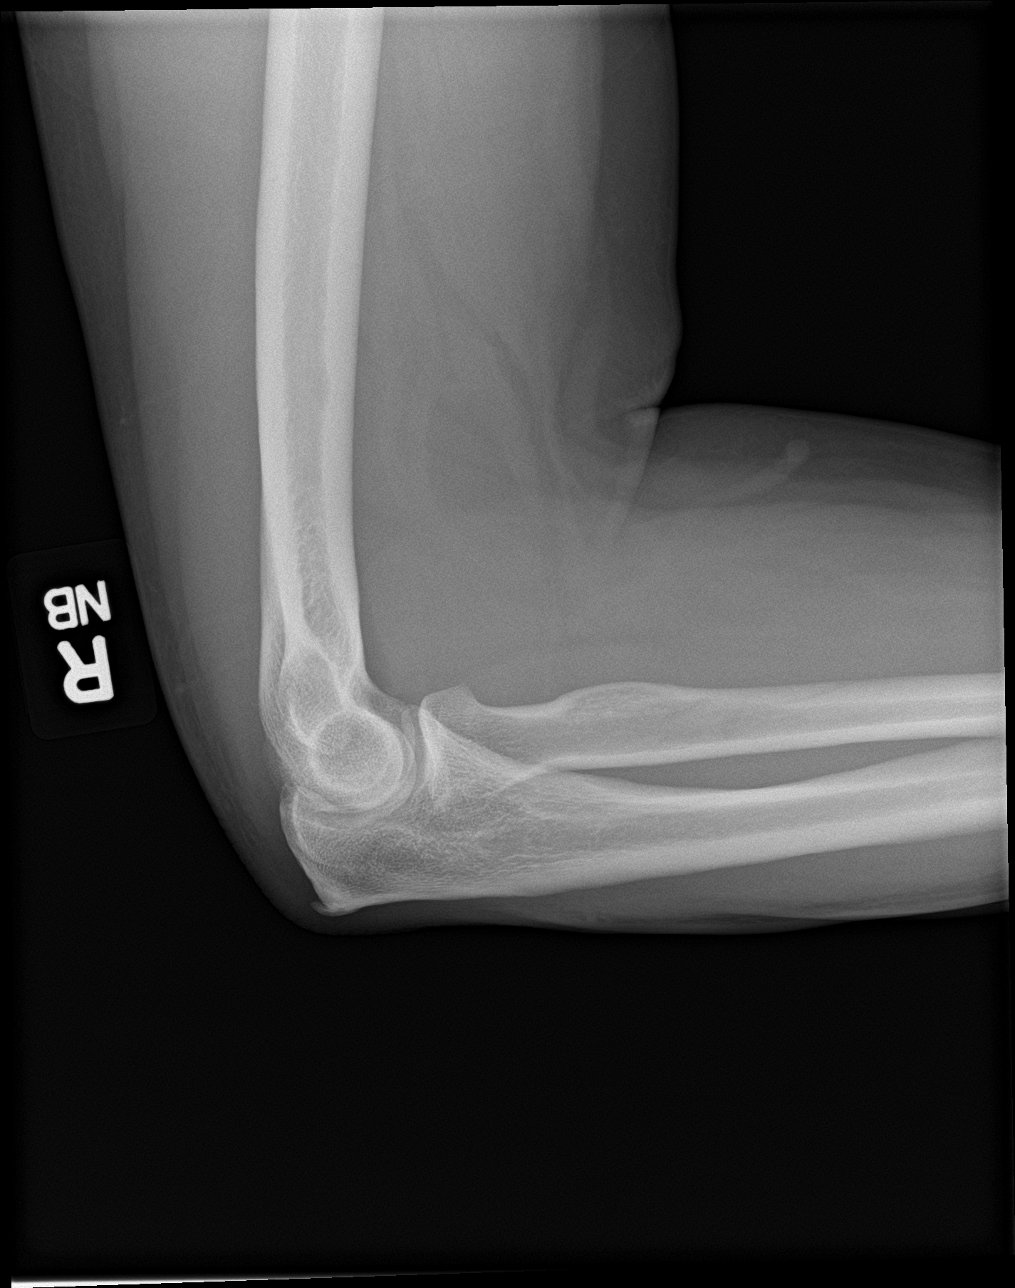

[elbow obl (2 of 2)]
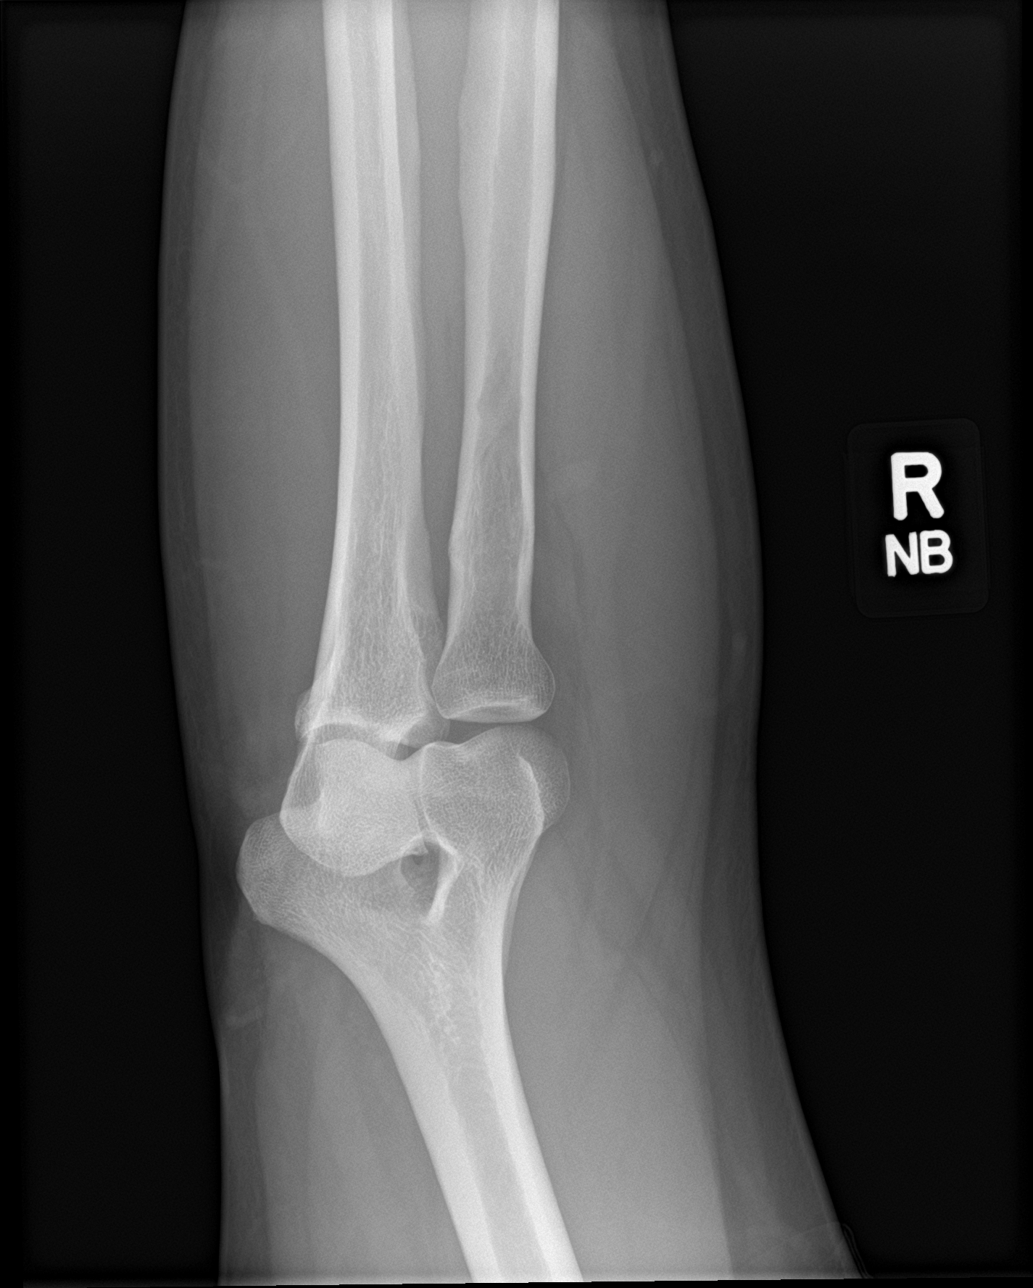

[4 of 4 positions shown; findings below may reference images not displayed]

FINDINGS: No acute fracture or dislocation. No aggressive osseous lesion.
Normal alignment. No joint effusion. Mild enthesopathic changes at
the triceps tendon insertion.

Soft tissue are unremarkable. No radiopaque foreign body or soft
tissue emphysema.
IMPRESSION: No acute osseous injury of the right elbow.

## 2021-10-09 ENCOUNTER — Encounter (INDEPENDENT_AMBULATORY_CARE_PROVIDER_SITE_OTHER): Payer: Self-pay

## 2021-11-28 ENCOUNTER — Ambulatory Visit (INDEPENDENT_AMBULATORY_CARE_PROVIDER_SITE_OTHER): Payer: BC Managed Care – PPO | Admitting: Family Medicine

## 2021-11-28 ENCOUNTER — Encounter: Payer: Self-pay | Admitting: Family Medicine

## 2021-11-28 VITALS — BP 117/84 | HR 64 | Ht 68.0 in | Wt 215.0 lb

## 2021-11-28 DIAGNOSIS — N2889 Other specified disorders of kidney and ureter: Secondary | ICD-10-CM

## 2021-11-28 DIAGNOSIS — I10 Essential (primary) hypertension: Secondary | ICD-10-CM | POA: Diagnosis not present

## 2021-11-28 DIAGNOSIS — Z23 Encounter for immunization: Secondary | ICD-10-CM

## 2021-11-28 DIAGNOSIS — E119 Type 2 diabetes mellitus without complications: Secondary | ICD-10-CM

## 2021-11-28 LAB — POCT UA - MICROALBUMIN
Albumin/Creatinine Ratio, Urine, POC: <30
Creatinine, POC: 300 mg/dL
Microalbumin Ur, POC: 30 mg/L

## 2021-11-28 LAB — POCT GLYCOSYLATED HEMOGLOBIN (HGB A1C): HbA1c, POC (prediabetic range): 5.3 % — AB (ref 5.7–6.4)

## 2021-11-28 MED ORDER — RYBELSUS 14 MG PO TABS: 14.0000 mg | ORAL_TABLET | Freq: Every day | ORAL | 1 refills | Status: DC

## 2021-11-28 NOTE — Patient Instructions (Signed)
Keep up the hard work.  Let's plan to follow up in 6 months.

## 2021-11-28 NOTE — Assessment & Plan Note (Signed)
BP is well controlled at current strength.  Will plan to continue current medications.

## 2021-11-28 NOTE — Assessment & Plan Note (Addendum)
Does not appear that he had MRI completed.  Re-ordered.

## 2021-11-28 NOTE — Assessment & Plan Note (Signed)
He has lost a significant amount of weight since last visit.  A1c is improved today.  Having nausea at 14mg  strength, will try reducing to 7mg .  F/u in 6 months.

## 2021-11-28 NOTE — Progress Notes (Signed)
Jerry Miller - 56 y.o. male MRN [id number]  Date of birth: 1965/04/09  Subjective Chief Complaint  Patient presents with   Diabetes    HPI Jerry Miller is a 56 y.o. male here today for follow up visit.   Reports that he is doing quite well.  HTN is currently managed with combination of lisinopril and HCTZ.  Tolerating well at current strength.  Denies side effects from medication.  Has not had chest pain, shortness of breath, palpitations, headache or vision changes.    Currently rybelsus for treatment of diabetes.  He is tolerating this well at 14mg  strength. He has been off of this for a couple weeks.  Weight is down 35lbs since last visit.  Diet is improved and he has been a little more active.      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    Past Surgical History:  Procedure Laterality Date   CHOLECYSTECTOMY  12/21/2018   [facility]   KIDNEY STONE SURGERY     KNEE ARTHROSCOPY W/ ACL RECONSTRUCTION AND PATELLA GRAFT      Social History   Socioeconomic History   Marital status: Married    Spouse name: Not on file   Number of children: Not on file   Years of education: Not on file   Highest education level: Not on file  Occupational History   Not on file  Tobacco Use   Smoking status: Never   Smokeless tobacco: Never  Substance and Sexual Activity   Alcohol use: Not on file   Drug use: Not on file   Sexual activity: Yes    Birth control/protection: Implant  Other Topics Concern   Not on file  Social History Narrative   Not on file   Social Determinants of Health   Financial Resource  Strain: Not on file  Food Insecurity: Not on file  Transportation Needs: Not on file  Physical Activity: Insufficiently Active (12/28/2018)   Exercise Vital Sign    Days of Exercise per Week: 7 days    Minutes of Exercise per Session: 20 min  Stress: Not on file  Social Connections: Not on file    Family History  Problem Relation Age of Onset   Diabetes Mother    Diabetes Father    Heart failure Father    Nephrolithiasis Sister     Health Maintenance  Topic Date Due   Diabetic kidney evaluation - Urine ACR  Never done   Zoster Vaccines- Shingrix (1 of 2) Never done   HEMOGLOBIN A1C  11/27/2021   OPHTHALMOLOGY EXAM  04/03/2022 (Originally 01/23/1978)   COVID-19 Vaccine (3 - Moderna series) 04/03/2022 (Originally 09/24/2019)   COLONOSCOPY (Pts 45-21yrs Insurance coverage will need to be confirmed)  05/28/2022 (Originally 01/23/2013)   Hepatitis C Screening  05/28/2022 (Originally 01/23/1986)   INFLUENZA VACCINE  06/01/2022 (Originally 10/01/2021)   Diabetic kidney evaluation - GFR measurement  05/28/2022   FOOT EXAM  11/29/2022   TETANUS/TDAP  05/28/2031   HIV Screening  Completed   HPV VACCINES  Aged Out     ----------------------------------------------------------------------------------------------------------------------------------------------------------------------------------------------------------------- Physical Exam  BP 117/84 (BP Location: Left Arm, Patient Position: Sitting, Cuff Size: Normal)   Pulse 64   Ht 5\' 8"  (1.727 m)   Wt 215 lb (97.5 kg)   SpO2 100%   BMI 32.69 kg/m   Physical Exam Constitutional:      Appearance: Normal appearance.  Eyes:     General: No scleral icterus. Cardiovascular:     Rate and Rhythm: Normal rate and regular rhythm.  Pulmonary:     Effort: Pulmonary effort is normal.     Breath sounds: Normal breath sounds.  Musculoskeletal:     Cervical back: Neck supple.  Neurological:     Mental Status: He is alert.   Psychiatric:        Mood and Affect: Mood normal.        Behavior: Behavior normal.     ------------------------------------------------------------------------------------------------------------------------------------------------------------------------------------------------------------------- Assessment and Plan  Essential hypertension BP is well controlled at current strength.  Will plan to continue current medications.    Controlled type 2 diabetes mellitus without complication, without long-term current use of insulin ([place]) He has lost a significant amount of weight since last visit.  A1c is improved today.  Having nausea at 14mg  strength, will try reducing to 7mg .  F/u in 6 months.   Renal mass Does not appear that he had MRI completed.  Re-ordered.    Meds ordered this encounter  Medications   Semaglutide (RYBELSUS) 14 MG TABS    Sig: Take 1 tablet (14 mg total) by mouth daily.    Dispense:  90 tablet    Refill:  1    Return in about 6 months (around 05/29/2022) for HTN/T2DM.    This visit occurred during the SARS-CoV-2 public health emergency.  Safety protocols were in place, including screening questions prior to the visit, additional usage of staff PPE, and extensive cleaning of exam room while observing appropriate contact time as indicated for disinfecting solutions.

## 2021-12-16 ENCOUNTER — Ambulatory Visit: Payer: BC Managed Care – PPO

## 2021-12-16 DIAGNOSIS — N2889 Other specified disorders of kidney and ureter: Secondary | ICD-10-CM

## 2021-12-16 DIAGNOSIS — N281 Cyst of kidney, acquired: Secondary | ICD-10-CM | POA: Diagnosis not present

## 2021-12-16 DIAGNOSIS — K760 Fatty (change of) liver, not elsewhere classified: Secondary | ICD-10-CM | POA: Diagnosis not present

## 2021-12-16 DIAGNOSIS — K429 Umbilical hernia without obstruction or gangrene: Secondary | ICD-10-CM | POA: Diagnosis not present

## 2021-12-16 DIAGNOSIS — Z9049 Acquired absence of other specified parts of digestive tract: Secondary | ICD-10-CM | POA: Diagnosis not present

## 2021-12-16 MED ORDER — GADOPICLENOL 0.5 MMOL/ML IV SOLN
10.0000 mL | Freq: Once | INTRAVENOUS | Status: AC | PRN
  Administered 2021-12-16: 10 mL via INTRAVENOUS

## 2022-01-29 ENCOUNTER — Telehealth: Payer: Self-pay

## 2022-01-29 NOTE — Telephone Encounter (Addendum)
Initiated Prior authorization [id number] 14MG  tablets Via: Covermymeds Case/Key:[id number] Status: approved as of 01/29/22 Reason:this request is approved from 01/30/2022 to 01/30/2025.  Notified Pt via: Mychart

## 2022-04-22 ENCOUNTER — Ambulatory Visit (INDEPENDENT_AMBULATORY_CARE_PROVIDER_SITE_OTHER): Payer: BC Managed Care – PPO

## 2022-04-22 ENCOUNTER — Ambulatory Visit (INDEPENDENT_AMBULATORY_CARE_PROVIDER_SITE_OTHER): Payer: BC Managed Care – PPO | Admitting: Sports Medicine

## 2022-04-22 DIAGNOSIS — M19079 Primary osteoarthritis, unspecified ankle and foot: Secondary | ICD-10-CM | POA: Diagnosis not present

## 2022-04-22 DIAGNOSIS — M7989 Other specified soft tissue disorders: Secondary | ICD-10-CM | POA: Diagnosis not present

## 2022-04-22 DIAGNOSIS — M79671 Pain in right foot: Secondary | ICD-10-CM

## 2022-04-22 DIAGNOSIS — M109 Gout, unspecified: Secondary | ICD-10-CM | POA: Diagnosis not present

## 2022-04-22 MED ORDER — PREDNISONE 50 MG PO TABS: ORAL_TABLET | ORAL | 0 refills | Status: DC

## 2022-04-22 NOTE — Progress Notes (Addendum)
    Procedures performed today:    Procedure: Real-time Ultrasound Guided aspirate/injection right first MTP Device: Samsung HS60  Verbal informed consent obtained.  Time-out conducted.  Noted no overlying erythema, induration, or other signs of local infection.  Skin prepped in a sterile fashion.  Local anesthesia: Topical Ethyl chloride.  With sterile technique and under real time ultrasound guidance: Advanced a 22-gauge needle into the first MTP, aspirated 55m cloudy fluid, syringe switched and 0.5 cc lidocaine, 0.5 cc kenalog 40 injected easily. Completed without difficulty  Advised to call if fevers/chills, erythema, induration, drainage, or persistent bleeding.  Images permanently stored and available for review in PACS.  Impression: Technically successful ultrasound guided aspiration/injection.  Independent interpretation of notes and tests performed by another provider:   None.  Brief History, Exam, Impression, and Recommendations:    Primary osteoarthritis of first metatarsophalangeal (MTP) joint, right Pleasant 57year old male, rapid onset swelling, pain right first metatarsophalangeal joint. No causative factors identified. Today we did an aspiration and injection of the right first MTP, will send off fluid for crystal analysis. Adding 5 days of prednisone, x-rays, light duty at work, return to see me in about a month. If symptoms are resolved at that point we can do serum uric acid levels.  Update: Crystal analysis is negative, this is likely osteoarthritis.    ____________________________________________ TGwen Her TDianah Field M.D., ABFM., CAQSM., AME. Primary Care and Sports Medicine [facility] Adjunct Professor of [employer] [facility] [employer]  [employer] manager

## 2022-04-22 NOTE — Assessment & Plan Note (Signed)
Pleasant 57 year old male, rapid onset swelling, pain right first metatarsophalangeal joint. No causative factors identified. Today we did an aspiration and injection of the right first MTP, will send off fluid for crystal analysis. Adding 5 days of prednisone, x-rays, light duty at work, return to see me in about a month. If symptoms are resolved at that point we can do serum uric acid levels.  Update: Crystal analysis is negative, this is likely osteoarthritis.

## 2022-04-23 LAB — SYNOVIAL FLUID, CRYSTAL

## 2022-04-24 ENCOUNTER — Encounter: Payer: Self-pay | Admitting: Sports Medicine

## 2022-05-22 ENCOUNTER — Ambulatory Visit: Payer: BC Managed Care – PPO | Admitting: Sports Medicine

## 2022-05-29 ENCOUNTER — Encounter: Payer: Self-pay | Admitting: Family Medicine

## 2022-05-29 ENCOUNTER — Ambulatory Visit (INDEPENDENT_AMBULATORY_CARE_PROVIDER_SITE_OTHER): Payer: BC Managed Care – PPO | Admitting: Family Medicine

## 2022-05-29 VITALS — BP 117/77 | HR 53 | Ht 68.0 in | Wt 220.0 lb

## 2022-05-29 DIAGNOSIS — E785 Hyperlipidemia, unspecified: Secondary | ICD-10-CM | POA: Diagnosis not present

## 2022-05-29 DIAGNOSIS — I10 Essential (primary) hypertension: Secondary | ICD-10-CM | POA: Diagnosis not present

## 2022-05-29 DIAGNOSIS — E119 Type 2 diabetes mellitus without complications: Secondary | ICD-10-CM

## 2022-05-29 DIAGNOSIS — Z23 Encounter for immunization: Secondary | ICD-10-CM

## 2022-05-29 MED ORDER — LISINOPRIL 10 MG PO TABS: 10.0000 mg | ORAL_TABLET | Freq: Every day | ORAL | 3 refills | Status: DC

## 2022-05-29 MED ORDER — TIRZEPATIDE 7.5 MG/0.5ML ~~LOC~~ SOAJ: 7.5000 mg | SUBCUTANEOUS | 0 refills | Status: DC

## 2022-05-29 MED ORDER — HYDROCHLOROTHIAZIDE 25 MG PO TABS: 25.0000 mg | ORAL_TABLET | Freq: Every day | ORAL | 3 refills | Status: DC

## 2022-05-29 MED ORDER — TIRZEPATIDE 5 MG/0.5ML ~~LOC~~ SOAJ: 5.0000 mg | SUBCUTANEOUS | 0 refills | Status: DC

## 2022-05-29 MED ORDER — TIRZEPATIDE 2.5 MG/0.5ML ~~LOC~~ SOAJ: 2.5000 mg | SUBCUTANEOUS | 0 refills | Status: DC

## 2022-05-29 NOTE — Assessment & Plan Note (Signed)
BP is well controlled with lisinopril and HCTZ.  Recommend continuation.

## 2022-05-29 NOTE — Assessment & Plan Note (Addendum)
Update lipid panel.  Elevated risk of cardiovascular disease ASCVD of 12.5%.  Coronary calcium score ordered.

## 2022-05-29 NOTE — Assessment & Plan Note (Signed)
Updating A1c along with labs.  We discussed change to mounaro.  Instructed on pen use.  F/u in 3-4 months.

## 2022-05-29 NOTE — Patient Instructions (Signed)
Let's try mounjaro to replace rybelsus.  See me again in 3-4 months to see how this is working for you.

## 2022-05-29 NOTE — Progress Notes (Signed)
Jerry Miller Orders - 57 y.o. male MRN [id number]  Date of birth: 13-Sep-1965  Subjective Chief Complaint  Patient presents with   Hypertension   Diabetes    HPI Jerry Miller is a 57 y.o. male here today for follow up.  He reports that he is doing well.  Remains on lisinopril and HCTZ for management of HTN. BP is well controlled.  He denies side effects from medication.  He has not had chest pain, shortness of breath, palpitations, headache or vision changes.   He reports that Rybelsus was too expensive when he went to pick this up last time. He did have some nausea with the 14mg  strength and was cutting this in half.  He would be open to trying something different.    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    Past Surgical History:  Procedure Laterality Date   CHOLECYSTECTOMY  12/21/2018   [facility]   KIDNEY STONE SURGERY     KNEE ARTHROSCOPY W/ ACL RECONSTRUCTION AND PATELLA GRAFT      Social History   Socioeconomic History   Marital status: Married    Spouse name: Not on file   Number of children: Not on file   Years of education: Not on file   Highest education level: Associate degree: occupational, Hotel manager, or vocational program  Occupational History   Not on file  Tobacco Use   Smoking status: Never   Smokeless tobacco: Never  Substance and Sexual Activity   Alcohol use: Not on file   Drug use: Not on file   Sexual activity: Yes    Birth control/protection: Implant  Other Topics Concern   Not on file  Social History Narrative   Not on file   Social Determinants of Health    Financial Resource Strain: Low Risk  (05/26/2022)   Overall Financial Resource Strain (CARDIA)    Difficulty of Paying Living Expenses: Not very hard  Food Insecurity: No Food Insecurity (05/26/2022)   Hunger Vital Sign    Worried About Running Out of Food in the Last Year: Never true    Ran Out of Food in the Last Year: Never true  Transportation Needs: No Transportation Needs (05/26/2022)   PRAPARE - Hydrologist (Medical): No    Lack of Transportation (Non-Medical): No  Physical Activity: Unknown (05/26/2022)   Exercise Vital Sign    Days of Exercise per Week: 0 days    Minutes of Exercise per Session: Not on file  Stress: Stress Concern Present (05/26/2022)   [place]    Feeling of Stress : To some extent  Social Connections: Socially Isolated (05/26/2022)   Social Connection and Isolation Panel [NHANES]    Frequency of Communication with Friends and Family: Three times a week    Frequency of Social Gatherings with Friends and Family: Once a week    Attends Religious Services: Never    Marine scientist or Organizations: No    Attends Archivist Meetings: Not on file    Marital  Status: Separated    Family History  Problem Relation Age of Onset   Diabetes Mother    Diabetes Father    Heart failure Father    Nephrolithiasis Sister     Health Maintenance  Topic Date Due   Diabetic kidney evaluation - eGFR measurement  05/28/2022   HEMOGLOBIN A1C  05/29/2022   INFLUENZA VACCINE  06/01/2022 (Originally 10/01/2021)   OPHTHALMOLOGY EXAM  11/29/2022 (Originally 01/23/1978)   COLONOSCOPY (Pts 45-41yrs Insurance coverage will need to be confirmed)  11/29/2022 (Originally 01/23/2013)   COVID-19 Vaccine (3 - 2023-24 season) 11/29/2022 (Originally 11/01/2021)   Diabetic kidney evaluation - Urine ACR  11/29/2022   FOOT EXAM  11/29/2022   DTaP/Tdap/Td (3 - Td or Tdap) 05/28/2031    HIV Screening  Completed   Zoster Vaccines- Shingrix  Completed   HPV VACCINES  Aged Out   Hepatitis C Screening  Discontinued     ----------------------------------------------------------------------------------------------------------------------------------------------------------------------------------------------------------------- Physical Exam BP 117/77 (BP Location: Left Arm, Patient Position: Sitting, Cuff Size: Normal)   Pulse (!) 53   Ht 5\' 8"  (1.727 m)   Wt 220 lb (99.8 kg)   SpO2 100%   BMI 33.45 kg/m   Physical Exam Constitutional:      Appearance: Normal appearance.  HENT:     Head: Normocephalic and atraumatic.  Eyes:     General: No scleral icterus. Cardiovascular:     Rate and Rhythm: Normal rate and regular rhythm.  Pulmonary:     Effort: Pulmonary effort is normal.     Breath sounds: Normal breath sounds.  Neurological:     Mental Status: He is alert.  Psychiatric:        Mood and Affect: Mood normal.        Behavior: Behavior normal.     ------------------------------------------------------------------------------------------------------------------------------------------------------------------------------------------------------------------- Assessment and Plan  Essential hypertension BP is well controlled with lisinopril and HCTZ.  Recommend continuation.    Controlled type 2 diabetes mellitus without complication, without long-term current use of insulin ([place]) Updating A1c along with labs.  We discussed change to mounaro.  Instructed on pen use.  F/u in 3-4 months.   Hyperlipidemia LDL goal <160 Update lipid panel.  Elevated risk of cardiovascular disease ASCVD of 12.5%.  Coronary calcium score ordered.     Meds ordered this encounter  Medications   hydrochlorothiazide (HYDRODIURIL) 25 MG tablet    Sig: Take 1 tablet (25 mg total) by mouth daily.    Dispense:  90 tablet    Refill:  3   lisinopril (ZESTRIL) 10 MG tablet    Sig:  Take 1 tablet (10 mg total) by mouth daily.    Dispense:  90 tablet    Refill:  3   tirzepatide (MOUNJARO) 2.5 MG/0.5ML Pen    Sig: Inject 2.5 mg into the skin once a week. Increase to 5mg  after 4 weeks    Dispense:  2 mL    Refill:  0   tirzepatide (MOUNJARO) 5 MG/0.5ML Pen    Sig: Inject 5 mg into the skin once a week. Increase to 7.5mg  after 4 weeks    Dispense:  2 mL    Refill:  0   tirzepatide (MOUNJARO) 7.5 MG/0.5ML Pen    Sig: Inject 7.5 mg into the skin once a week.    Dispense:  6 mL    Refill:  0    Return in about 4 months (around 09/28/2022) for F/u T2DM.    This visit occurred during the SARS-CoV-2 public health emergency.  Safety protocols were in place, including screening questions prior to the visit, additional usage of staff PPE, and extensive cleaning of exam room while observing appropriate contact time as indicated for disinfecting solutions.

## 2022-05-30 LAB — CBC WITH DIFFERENTIAL/PLATELET
Absolute Monocytes: 570 cells/uL (ref 200–950)
Basophils Absolute: 42 cells/uL (ref 0–200)
Basophils Relative: 0.7 %
Eosinophils Absolute: 102 cells/uL (ref 15–500)
Eosinophils Relative: 1.7 %
HCT: 45.4 % (ref 38.5–50.0)
Hemoglobin: 15.4 g/dL (ref 13.2–17.1)
Lymphs Abs: 1566 cells/uL (ref 850–3900)
MCH: 30.6 pg (ref 27.0–33.0)
MCHC: 33.9 g/dL (ref 32.0–36.0)
MCV: 90.1 fL (ref 80.0–100.0)
MPV: 11.2 fL (ref 7.5–12.5)
Monocytes Relative: 9.5 %
Neutro Abs: 3720 cells/uL (ref 1500–7800)
Neutrophils Relative %: 62 %
Platelets: 301 10*3/uL (ref 140–400)
RBC: 5.04 10*6/uL (ref 4.20–5.80)
RDW: 12.7 % (ref 11.0–15.0)
Total Lymphocyte: 26.1 %
WBC: 6 10*3/uL (ref 3.8–10.8)

## 2022-05-30 LAB — COMPLETE METABOLIC PANEL WITH GFR
AG Ratio: 1.5 (calc) (ref 1.0–2.5)
ALT: 16 U/L (ref 9–46)
AST: 11 U/L (ref 10–35)
Albumin: 4.3 g/dL (ref 3.6–5.1)
Alkaline phosphatase (APISO): 58 U/L (ref 35–144)
BUN: 17 mg/dL (ref 7–25)
CO2: 27 mmol/L (ref 20–32)
Calcium: 9.5 mg/dL (ref 8.6–10.3)
Chloride: 104 mmol/L (ref 98–110)
Creat: 0.9 mg/dL (ref 0.70–1.30)
Globulin: 2.8 g/dL (calc) (ref 1.9–3.7)
Glucose, Bld: 108 mg/dL — ABNORMAL HIGH (ref 65–99)
Potassium: 4.2 mmol/L (ref 3.5–5.3)
Sodium: 141 mmol/L (ref 135–146)
Total Bilirubin: 0.6 mg/dL (ref 0.2–1.2)
Total Protein: 7.1 g/dL (ref 6.1–8.1)
eGFR: 101 mL/min/{1.73_m2} (ref >=60)

## 2022-05-30 LAB — LIPID PANEL W/REFLEX DIRECT LDL
Cholesterol: 206 mg/dL — ABNORMAL HIGH (ref <200)
HDL: 45 mg/dL (ref >=40)
LDL Cholesterol (Calc): 136 mg/dL (calc) — ABNORMAL HIGH
Non-HDL Cholesterol (Calc): 161 mg/dL (calc) — ABNORMAL HIGH (ref <130)
Total CHOL/HDL Ratio: 4.6 (calc) (ref <5.0)
Triglycerides: 128 mg/dL (ref <150)

## 2022-05-30 LAB — HEMOGLOBIN A1C
Hgb A1c MFr Bld: 5.9 % of total Hgb — ABNORMAL HIGH (ref <5.7)
Mean Plasma Glucose: 123 mg/dL
eAG (mmol/L): 6.8 mmol/L

## 2022-06-24 ENCOUNTER — Other Ambulatory Visit: Payer: Self-pay | Admitting: Family Medicine

## 2022-06-24 NOTE — Telephone Encounter (Signed)
He should increase to 7.5mg, is this not available?

## 2022-06-25 NOTE — Telephone Encounter (Signed)
Mounjaro 7.5 mg is currently on back order, per pharmacy tech.

## 2022-06-27 ENCOUNTER — Other Ambulatory Visit: Payer: Self-pay | Admitting: Family Medicine

## 2022-06-27 DIAGNOSIS — K2100 Gastro-esophageal reflux disease with esophagitis, without bleeding: Secondary | ICD-10-CM

## 2022-08-13 ENCOUNTER — Telehealth: Payer: Self-pay | Admitting: Family Medicine

## 2022-08-13 NOTE — Telephone Encounter (Signed)
Patient called stating the pharmacy in now requiring authorization from Dr. Ashley Royalty to fill the prescription of tirzepatide [facility]) 7.5 MG/0.5ML Pen [[id number]]

## 2022-08-13 NOTE — Telephone Encounter (Signed)
PA for Mounjaro has been submitted.  ?

## 2022-08-26 ENCOUNTER — Ambulatory Visit (INDEPENDENT_AMBULATORY_CARE_PROVIDER_SITE_OTHER): Payer: BC Managed Care – PPO | Admitting: Sports Medicine

## 2022-08-26 ENCOUNTER — Encounter: Payer: Self-pay | Admitting: Sports Medicine

## 2022-08-26 VITALS — BP 127/88 | HR 65 | Ht 68.0 in | Wt 216.0 lb

## 2022-08-26 DIAGNOSIS — E538 Deficiency of other specified B group vitamins: Secondary | ICD-10-CM

## 2022-08-26 DIAGNOSIS — R5383 Other fatigue: Secondary | ICD-10-CM | POA: Diagnosis not present

## 2022-08-26 DIAGNOSIS — E559 Vitamin D deficiency, unspecified: Secondary | ICD-10-CM | POA: Diagnosis not present

## 2022-08-26 DIAGNOSIS — R7303 Prediabetes: Secondary | ICD-10-CM | POA: Diagnosis not present

## 2022-08-26 MED ORDER — SERTRALINE HCL 50 MG PO TABS
50.0000 mg | ORAL_TABLET | Freq: Every day | ORAL | 3 refills | Status: DC
Start: 2022-08-26 — End: 2022-10-02

## 2022-08-26 NOTE — Progress Notes (Addendum)
    Procedures performed today:    None.  Independent interpretation of notes and tests performed by another provider:   None.  Brief History, Exam, Impression, and Recommendations:    Fatigue Pleasant 57 year old male 3 years post divorce, he has been having some depressed mood, anxiety, lack of motivation at work. I explained to him that the differential here was multifactorial, he certainly meets criteria for depression with his PHQ and GAD scores, adding Zoloft 50 mg daily, he can follow this up with his PCP. Adding labs including CBC, CMP, A1c, TSH, testosterone, vitamin D, B12. I would like to do a home sleep study, he does not desire to do a CPAP but I think it would be good to get a diagnosis. He will continue behavioral therapy, I think he can follow all this up with his PCP. He is also complaining of some left upper quadrant pain, intermittent, not associated with exertion. I suspect this is likely related to his anxiety, potentially splenic flexure syndrome, I would like him to try a low FODMAP diet, he will follow this up with his PCP.  I spent 30 minutes of total time managing this patient today, this includes chart review, face to face, and non-face to face time.  ____________________________________________ Ihor Austin. Benjamin Stain, M.D., ABFM., CAQSM., AME. Primary Care and Sports Medicine [facility]  Adjunct Professor of Family Medicine  [employer]  Restaurant manager, fast food

## 2022-08-26 NOTE — Assessment & Plan Note (Signed)
Pleasant 57 year old male 3 years post divorce, he has been having some depressed mood, anxiety, lack of motivation at work. I explained to him that the differential here was multifactorial, he certainly meets criteria for depression with his PHQ and GAD scores, adding Zoloft 50 mg daily, he can follow this up with his PCP. Adding labs including CBC, CMP, A1c, TSH, testosterone, vitamin D, B12. I would like to do a home sleep study, he does not desire to do a CPAP but I think it would be good to get a diagnosis. He will continue behavioral therapy, I think he can follow all this up with his PCP. He is also complaining of some left upper quadrant pain, intermittent, not associated with exertion. I suspect this is likely related to his anxiety, potentially splenic flexure syndrome, I would like him to try a low FODMAP diet, he will follow this up with his PCP.

## 2022-08-26 NOTE — Patient Instructions (Signed)
Look into splenic flexure syndrome

## 2022-08-26 NOTE — Addendum Note (Signed)
Addended by: Monica Becton on: 08/26/2022 10:12 AM   Modules accepted: Orders

## 2022-08-27 LAB — CBC
HCT: 47.1 % (ref 38.5–50.0)
Hemoglobin: 15.9 g/dL (ref 13.2–17.1)
MCHC: 33.8 g/dL (ref 32.0–36.0)
MCV: 90.9 fL (ref 80.0–100.0)

## 2022-08-27 LAB — VITAMIN D 25 HYDROXY (VIT D DEFICIENCY, FRACTURES): Vit D, 25-Hydroxy: 14 ng/mL — ABNORMAL LOW (ref 30–100)

## 2022-08-27 LAB — COMPREHENSIVE METABOLIC PANEL: Total Bilirubin: 0.6 mg/dL (ref 0.2–1.2)

## 2022-08-27 LAB — VITAMIN B12: Vitamin B-12: 323 pg/mL (ref 200–1100)

## 2022-08-27 MED ORDER — VITAMIN D (ERGOCALCIFEROL) 1.25 MG (50000 UNIT) PO CAPS: 50000.0000 [IU] | ORAL_CAPSULE | ORAL | 0 refills | Status: DC

## 2022-08-27 NOTE — Addendum Note (Signed)
Addended by: Monica Becton on: 08/27/2022 04:04 PM   Modules accepted: Orders

## 2022-08-29 LAB — CBC
MCH: 30.7 pg (ref 27.0–33.0)
MPV: 11.7 fL (ref 7.5–12.5)
Platelets: 311 Thousand/uL (ref 140–400)
RBC: 5.18 Million/uL (ref 4.20–5.80)
RDW: 12.4 % (ref 11.0–15.0)
WBC: 7.7 Thousand/uL (ref 3.8–10.8)

## 2022-08-29 LAB — HEMOGLOBIN A1C
Hgb A1c MFr Bld: 5.6 % of total Hgb (ref <5.7)
Mean Plasma Glucose: 114 mg/dL
eAG (mmol/L): 6.3 mmol/L

## 2022-08-29 LAB — COMPREHENSIVE METABOLIC PANEL
AG Ratio: 1.5 (calc) (ref 1.0–2.5)
Globulin: 2.8 g/dL (calc) (ref 1.9–3.7)
Glucose, Bld: 110 mg/dL — ABNORMAL HIGH (ref 65–99)
Potassium: 4.5 mmol/L (ref 3.5–5.3)

## 2022-08-29 LAB — COMPREHENSIVE METABOLIC PANEL WITH GFR
ALT: 27 U/L (ref 9–46)
AST: 16 U/L (ref 10–35)
Albumin: 4.3 g/dL (ref 3.6–5.1)
Alkaline phosphatase (APISO): 65 U/L (ref 35–144)
BUN: 17 mg/dL (ref 7–25)
CO2: 27 mmol/L (ref 20–32)
Calcium: 10 mg/dL (ref 8.6–10.3)
Chloride: 101 mmol/L (ref 98–110)
Creat: 0.92 mg/dL (ref 0.70–1.30)
Sodium: 140 mmol/L (ref 135–146)
Total Protein: 7.1 g/dL (ref 6.1–8.1)

## 2022-08-29 LAB — TESTOSTERONE, FREE & TOTAL
Free Testosterone: 73.1 pg/mL (ref 35.0–155.0)
Testosterone, Total, LC-MS-MS: 335 ng/dL (ref 250–1100)

## 2022-08-29 LAB — TSH: TSH: 2.78 m[IU]/L (ref 0.40–4.50)

## 2022-09-17 ENCOUNTER — Other Ambulatory Visit: Payer: Self-pay | Admitting: Sports Medicine

## 2022-09-19 ENCOUNTER — Other Ambulatory Visit: Payer: Self-pay | Admitting: Sports Medicine

## 2022-09-19 DIAGNOSIS — R5383 Other fatigue: Secondary | ICD-10-CM

## 2022-09-29 ENCOUNTER — Telehealth: Payer: Self-pay | Admitting: Family Medicine

## 2022-09-29 NOTE — Telephone Encounter (Signed)
[facility], Terri P, called and [employer] needs Peer to Peer from Provider within 24hrs from 11am today. Please call [facility] after this is completed.  [phone]  REF: [id number]  [facility] [phone]

## 2022-09-30 NOTE — Telephone Encounter (Signed)
Called today and was placed on hold for 25 minutes.  Can we schedule a time to call?

## 2022-09-30 NOTE — Telephone Encounter (Signed)
Task completed. [employer], spoke to Hagerman. She stated that patient does not meet the medical criteria, but the study will be authorized as a educational study. Auth obtained, ref [id number]. Valid from 09/29/22 to 11/27/22. Attempted to call [facility], no answer. Left a detailed vm msg. Direct call back info was provided. Patient's appt notes has been updated with the following auth information. No further action is required.

## 2022-10-02 ENCOUNTER — Telehealth: Payer: Self-pay

## 2022-10-02 ENCOUNTER — Encounter: Payer: Self-pay | Admitting: Family Medicine

## 2022-10-02 ENCOUNTER — Ambulatory Visit (INDEPENDENT_AMBULATORY_CARE_PROVIDER_SITE_OTHER): Payer: BC Managed Care – PPO | Admitting: Family Medicine

## 2022-10-02 VITALS — BP 105/70 | HR 55 | Ht 68.0 in | Wt 218.0 lb

## 2022-10-02 DIAGNOSIS — F320 Major depressive disorder, single episode, mild: Secondary | ICD-10-CM | POA: Diagnosis not present

## 2022-10-02 DIAGNOSIS — F329 Major depressive disorder, single episode, unspecified: Secondary | ICD-10-CM | POA: Insufficient documentation

## 2022-10-02 DIAGNOSIS — I10 Essential (primary) hypertension: Secondary | ICD-10-CM

## 2022-10-02 DIAGNOSIS — E559 Vitamin D deficiency, unspecified: Secondary | ICD-10-CM | POA: Diagnosis not present

## 2022-10-02 DIAGNOSIS — E119 Type 2 diabetes mellitus without complications: Secondary | ICD-10-CM

## 2022-10-02 MED ORDER — BUPROPION HCL ER (XL) 150 MG PO TB24: 150.0000 mg | ORAL_TABLET | Freq: Every day | ORAL | 1 refills | Status: DC

## 2022-10-02 MED ORDER — HYDROCHLOROTHIAZIDE 12.5 MG PO TABS
12.5000 mg | ORAL_TABLET | Freq: Every day | ORAL | 1 refills | Status: DC
Start: 2022-10-02 — End: 2023-01-02

## 2022-10-02 MED ORDER — VITAMIN D (ERGOCALCIFEROL) 1.25 MG (50000 UNIT) PO CAPS: 50000.0000 [IU] | ORAL_CAPSULE | ORAL | 1 refills | Status: AC

## 2022-10-02 MED ORDER — TIRZEPATIDE 7.5 MG/0.5ML ~~LOC~~ SOAJ: 7.5000 mg | SUBCUTANEOUS | 0 refills | Status: DC

## 2022-10-02 MED ORDER — MOUNJARO 5 MG/0.5ML ~~LOC~~ SOAJ: 5.0000 mg | SUBCUTANEOUS | 0 refills | Status: DC

## 2022-10-02 NOTE — Assessment & Plan Note (Signed)
Continue high dose vitamin d weekly for now.

## 2022-10-02 NOTE — Assessment & Plan Note (Signed)
He hasn't really noted any benefit from sertraline.  Has been off for a week, no need to taper.  Starting bupropion 150mg  daily.

## 2022-10-02 NOTE — Assessment & Plan Note (Signed)
Blood sugars have been well controlled.  We'll plan to restart mounjaro 5mg  x1 month then increase to 7.5mg .

## 2022-10-02 NOTE — Progress Notes (Signed)
Jerry Miller - 57 y.o. male MRN [id number]  Date of birth: 24-Oct-1965  Subjective Chief Complaint  Patient presents with   Diabetes    HPI Jerry Miller is a 57 y.o. male here today for follow up visit.   He reports that he is doing ok.   Seen by Dr. Benjamin Stain in June with complaint of fatigue and depressed mood.  Zoloft added at that time and labs updated.  Vitamin D levels were noted to be low.  Hasn't really noted any difference with zoloft.  It sounds like bupropion was considered as well but he did have some anxiety at that time and there was concern about worsening this.  He feels that anxiety is minimal and feels more depressed than anything.    Blood sugars well controlled on labs in June with A1c of 5.6%.  He continues on mounjaro and seems to be doing well with this. He has been off of this for about 1 month because he has been unable to get his mounjaro.   He has not noted any significant side effects at current strength.    He continues on lisinopril and hydrochlorothiazide for management of HTN.  BP today is well controlled.  He denies chest pain, shortness of breath, palpitations, headache or vision changes.   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    Past Surgical History:  Procedure Laterality Date   CHOLECYSTECTOMY  12/21/2018   [facility]   KIDNEY STONE SURGERY     KNEE ARTHROSCOPY W/ ACL RECONSTRUCTION AND PATELLA GRAFT      Social History   Socioeconomic History   Marital status: Married    Spouse name: Not on file   Number of children: Not on file   Years of education: Not on  file   Highest education level: Associate degree: occupational, Scientist, product/process development, or vocational program  Occupational History   Not on file  Tobacco Use   Smoking status: Never   Smokeless tobacco: Never  Substance and Sexual Activity   Alcohol use: Not on file   Drug use: Not on file   Sexual activity: Yes    Birth control/protection: Implant  Other Topics Concern   Not on file  Social History Narrative   Not on file   Social Determinants of Health   Financial Resource Strain: Low Risk  (05/26/2022)   Overall Financial Resource Strain (CARDIA)    Difficulty of Paying Living Expenses: Not very hard  Food Insecurity: No Food Insecurity (05/26/2022)   Hunger Vital Sign    Worried About Running Out of Food in the Last Year: Never true    Ran Out of Food in the Last Year: Never true  Transportation Needs: No Transportation Needs (05/26/2022)   PRAPARE - Administrator, Civil Service (Medical): No    Lack of Transportation (Non-Medical): No  Physical Activity: Unknown (05/26/2022)   Exercise Vital Sign    Days of Exercise per Week: 0 days    Minutes of Exercise per Session: Not on file  Stress: Stress Concern Present (05/26/2022)   [employer]  of Occupational Health - Occupational Stress Questionnaire    Feeling of Stress : To some extent  Social Connections: Socially Isolated (05/26/2022)   Social Connection and Isolation Panel [NHANES]    Frequency of Communication with Friends and Family: Three times a week    Frequency of Social Gatherings with Friends and Family: Once a week    Attends Religious Services: Never    Database administrator or Organizations: No    Attends Engineer, structural: Not on file    Marital Status: Separated    Family History  Problem Relation Age of Onset   Diabetes Mother    Diabetes Father    Heart failure Father    Nephrolithiasis Sister     Health Maintenance  Topic Date Due   INFLUENZA VACCINE  10/02/2022    OPHTHALMOLOGY EXAM  11/29/2022 (Originally 01/23/1978)   Colonoscopy  11/29/2022 (Originally 01/23/2013)   COVID-19 Vaccine (3 - 2023-24 season) 11/29/2022 (Originally 11/01/2021)   Diabetic kidney evaluation - Urine ACR  11/29/2022   FOOT EXAM  11/29/2022   HEMOGLOBIN A1C  02/25/2023   Diabetic kidney evaluation - eGFR measurement  08/26/2023   DTaP/Tdap/Td (3 - Td or Tdap) 05/28/2031   HIV Screening  Completed   Zoster Vaccines- Shingrix  Completed   HPV VACCINES  Aged Out   Hepatitis C Screening  Discontinued     ----------------------------------------------------------------------------------------------------------------------------------------------------------------------------------------------------------------- Physical Exam BP 105/70 (BP Location: Left Arm, Patient Position: Sitting, Cuff Size: Large)   Pulse (!) 55   Ht 5\' 8"  (1.727 m)   Wt 218 lb (98.9 kg)   SpO2 100%   BMI 33.15 kg/m   Physical Exam Constitutional:      Appearance: Normal appearance.  HENT:     Head: Normocephalic and atraumatic.  Cardiovascular:     Rate and Rhythm: Normal rate and regular rhythm.  Pulmonary:     Effort: Pulmonary effort is normal.     Breath sounds: Normal breath sounds.  Musculoskeletal:     Cervical back: Neck supple.  Neurological:     General: No focal deficit present.     Mental Status: He is alert.  Psychiatric:        Mood and Affect: Mood normal.        Behavior: Behavior normal.     ------------------------------------------------------------------------------------------------------------------------------------------------------------------------------------------------------------------- Assessment and Plan  Essential hypertension BP is a little low, decrease hydrochlorothiazide to 12.5mg .  Continue lisinopril at current strength.   Controlled type 2 diabetes mellitus without complication, without long-term current use of insulin ([facility]) Blood sugars have  been well controlled.  We'll plan to restart mounjaro 5mg  x1 month then increase to 7.5mg .   MDD (major depressive disorder) He hasn't really noted any benefit from sertraline.  Has been off for a week, no need to taper.  Starting bupropion 150mg  daily.   Vitamin D deficiency Continue high dose vitamin d weekly for now.    Meds ordered this encounter  Medications   hydrochlorothiazide (HYDRODIURIL) 12.5 MG tablet    Sig: Take 1 tablet (12.5 mg total) by mouth daily.    Dispense:  90 tablet    Refill:  1   Vitamin D, Ergocalciferol, (DRISDOL) 1.25 MG (50000 UNIT) CAPS capsule    Sig: Take 1 capsule (50,000 Units total) by mouth every 7 (seven) days. Take for 8 total doses(weeks)    Dispense:  8 capsule    Refill:  1   tirzepatide (MOUNJARO) 5 MG/0.5ML Pen    Sig: Inject 5 mg  into the skin once a week.    Dispense:  2 mL    Refill:  0   tirzepatide (MOUNJARO) 7.5 MG/0.5ML Pen    Sig: Inject 7.5 mg into the skin once a week.    Dispense:  6 mL    Refill:  0   buPROPion (WELLBUTRIN XL) 150 MG 24 hr tablet    Sig: Take 1 tablet (150 mg total) by mouth daily.    Dispense:  90 tablet    Refill:  1    Return in about 3 months (around 01/02/2023) for f/u T2DM/HTN/Mood.    This visit occurred during the SARS-CoV-2 public health emergency.  Safety protocols were in place, including screening questions prior to the visit, additional usage of staff PPE, and extensive cleaning of exam room while observing appropriate contact time as indicated for disinfecting solutions.

## 2022-10-02 NOTE — Assessment & Plan Note (Signed)
BP is a little low, decrease hydrochlorothiazide to 12.5mg .  Continue lisinopril at current strength.

## 2022-10-02 NOTE — Patient Instructions (Signed)
Stop sertraline.  Start bupropion  Restart mounjaro 5mg  x1 month then increase to 7.5mg .   Continue vitamin d weekly.   Reduce hydrochlorothiazide to 12.5mg  daily.

## 2022-10-02 NOTE — Telephone Encounter (Signed)
Jerry Miller is approved by [employer] [employer]. Approval dates:  10/02/22 to 10/01/25

## 2022-10-16 ENCOUNTER — Ambulatory Visit: Payer: BC Managed Care – PPO | Admitting: Family Medicine

## 2023-01-02 ENCOUNTER — Ambulatory Visit (INDEPENDENT_AMBULATORY_CARE_PROVIDER_SITE_OTHER): Payer: BC Managed Care – PPO | Admitting: Family Medicine

## 2023-01-02 ENCOUNTER — Encounter: Payer: Self-pay | Admitting: Family Medicine

## 2023-01-02 VITALS — BP 144/93 | HR 70 | Ht 68.0 in | Wt 200.0 lb

## 2023-01-02 DIAGNOSIS — G44209 Tension-type headache, unspecified, not intractable: Secondary | ICD-10-CM

## 2023-01-02 DIAGNOSIS — L918 Other hypertrophic disorders of the skin: Secondary | ICD-10-CM | POA: Diagnosis not present

## 2023-01-02 DIAGNOSIS — F320 Major depressive disorder, single episode, mild: Secondary | ICD-10-CM

## 2023-01-02 DIAGNOSIS — E785 Hyperlipidemia, unspecified: Secondary | ICD-10-CM | POA: Diagnosis not present

## 2023-01-02 DIAGNOSIS — I10 Essential (primary) hypertension: Secondary | ICD-10-CM

## 2023-01-02 DIAGNOSIS — E119 Type 2 diabetes mellitus without complications: Secondary | ICD-10-CM

## 2023-01-02 LAB — POCT GLYCOSYLATED HEMOGLOBIN (HGB A1C): HbA1c, POC (controlled diabetic range): 4.7 % (ref 0.0–7.0)

## 2023-01-02 MED ORDER — DEXAMETHASONE SODIUM PHOSPHATE 10 MG/ML IJ SOLN
10.0000 mg | Freq: Once | INTRAMUSCULAR | Status: AC
  Administered 2023-01-02: 10 mg via INTRAMUSCULAR

## 2023-01-02 MED ORDER — DEXAMETHASONE SODIUM PHOSPHATE 10 MG/ML IJ SOLN: 10.0000 mg | Freq: Once | INTRAMUSCULAR | Status: DC

## 2023-01-02 MED ORDER — KETOROLAC TROMETHAMINE 30 MG/ML IJ SOLN
30.0000 mg | Freq: Once | INTRAMUSCULAR | Status: AC
Start: 2023-01-02 — End: 2023-01-02
  Administered 2023-01-02: 30 mg via INTRAMUSCULAR

## 2023-01-02 MED ORDER — BUTALBITAL-APAP-CAFFEINE 50-325-40 MG PO TABS: 1.0000 | ORAL_TABLET | Freq: Four times a day (QID) | ORAL | 0 refills | Status: AC | PRN

## 2023-01-02 MED ORDER — TIRZEPATIDE 7.5 MG/0.5ML ~~LOC~~ SOAJ: 7.5000 mg | SUBCUTANEOUS | 2 refills | Status: AC

## 2023-01-02 NOTE — Progress Notes (Unsigned)
Jerry Miller - 57 y.o. male MRN [id number]  Date of birth: 11/07/1965  Subjective No chief complaint on file.   HPI Jerry Miller is a 57 y.o. male here today for follow up.   He reports that he is doing pretty well.  Has some skin tags that are irritated that he would like to have treated.   Prescribed lisinopril and hydrochlorothiazide for management of HTN.  He has not been taking these recently.  BP is elevated today.  Denies chest pain, shortness of breath, palpitations, headache or vision changes.   He is taking mounjaro, currently at *** strength.  Overall he is tolerating this pretty well.   Mood is stable with bupropion at current strength.    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    Past Surgical History:  Procedure Laterality Date   CHOLECYSTECTOMY  12/21/2018   [facility]   KIDNEY STONE SURGERY     KNEE ARTHROSCOPY W/ ACL RECONSTRUCTION AND PATELLA GRAFT      Social History   Socioeconomic History   Marital status: Married    Spouse name: Not on file   Number of children: Not on file   Years of education: Not on file   Highest education level: Associate degree: occupational, Scientist, product/process development, or vocational program  Occupational History   Not on file  Tobacco Use   Smoking status: Never   Smokeless tobacco: Never  Substance and Sexual Activity   Alcohol use: Not on file   Drug use: Not on file   Sexual activity: Yes    Birth control/protection: Implant  Other Topics Concern   Not on file  Social History Narrative   Not on file   Social Determinants of Health   Financial Resource  Strain: Low Risk  (12/30/2022)   Overall Financial Resource Strain (CARDIA)    Difficulty of Paying Living Expenses: Not very hard  Food Insecurity: No Food Insecurity (12/30/2022)   Hunger Vital Sign    Worried About Running Out of Food in the Last Year: Never true    Ran Out of Food in the Last Year: Never true  Transportation Needs: No Transportation Needs (12/30/2022)   PRAPARE - Administrator, Civil Service (Medical): No    Lack of Transportation (Non-Medical): No  Physical Activity: Unknown (12/30/2022)   Exercise Vital Sign    Days of Exercise per Week: 0 days    Minutes of Exercise per Session: Not on file  Stress: Stress Concern Present (12/30/2022)   [employer] of Occupational Health - Occupational Stress Questionnaire    Feeling of Stress : Very much  Social Connections: Socially Isolated (12/30/2022)   Social Connection and Isolation Panel [NHANES]    Frequency of Communication with Friends and Family: More than three times a week    Frequency of Social Gatherings with Friends and Family: Once a week    Attends Religious Services: Never    Database administrator or Organizations: No    Attends Engineer, structural: Not on file    Marital Status: Separated  Family History  Problem Relation Age of Onset   Diabetes Mother    Diabetes Father    Heart failure Father    Nephrolithiasis Sister     Health Maintenance  Topic Date Due   OPHTHALMOLOGY EXAM  Never done   Colonoscopy  Never done   INFLUENZA VACCINE  10/02/2022   COVID-19 Vaccine (3 - 2023-24 season) 11/02/2022   Diabetic kidney evaluation - Urine ACR  11/29/2022   FOOT EXAM  11/29/2022   HEMOGLOBIN A1C  02/25/2023   Diabetic kidney evaluation - eGFR measurement  08/26/2023   DTaP/Tdap/Td (3 - Td or Tdap) 05/28/2031   HIV Screening  Completed   Zoster Vaccines- Shingrix  Completed   HPV VACCINES  Aged Out   Hepatitis C Screening  Discontinued      ----------------------------------------------------------------------------------------------------------------------------------------------------------------------------------------------------------------- Physical Exam There were no vitals taken for this visit.  Physical Exam  ------------------------------------------------------------------------------------------------------------------------------------------------------------------------------------------------------------------- Assessment and Plan  No problem-specific Assessment & Plan notes found for this encounter.   No orders of the defined types were placed in this encounter.   No follow-ups on file.    This visit occurred during the SARS-CoV-2 public health emergency.  Safety protocols were in place, including screening questions prior to the visit, additional usage of staff PPE, and extensive cleaning of exam room while observing appropriate contact time as indicated for disinfecting solutions.

## 2023-01-02 NOTE — Patient Instructions (Addendum)
Try adding magnesium glycinate 250-500mg  for headache Butalbital for severe headache.   Restart lisinopril.

## 2023-01-04 ENCOUNTER — Encounter: Payer: Self-pay | Admitting: Family Medicine

## 2023-01-04 DIAGNOSIS — L918 Other hypertrophic disorders of the skin: Secondary | ICD-10-CM | POA: Insufficient documentation

## 2023-01-04 DIAGNOSIS — G44209 Tension-type headache, unspecified, not intractable: Secondary | ICD-10-CM | POA: Insufficient documentation

## 2023-01-04 NOTE — Assessment & Plan Note (Signed)
Multiple skin tags treated today with liquid nitrogen.  Tolerated procedure well.

## 2023-01-04 NOTE — Assessment & Plan Note (Signed)
Given Toradol and Decadron injections today.  Will also add butalbital as needed for severe headache.  We discussed limiting this to only severe episodes of headache.

## 2023-01-04 NOTE — Assessment & Plan Note (Signed)
Doing pretty well with bupropion at current strength.

## 2023-01-04 NOTE — Assessment & Plan Note (Signed)
Blood sugars have been well controlled.  We'll plan to continue Mounjaro at 7.5 mg weekly.  Encouraged to continue diet and lifestyle change.

## 2023-01-04 NOTE — Assessment & Plan Note (Signed)
Blood pressure is elevated.  He has not been taking his blood pressure medication.  Will add lisinopril back on.

## 2023-01-04 NOTE — Assessment & Plan Note (Signed)
Coronary calcium score ordered previously however he never had this completed.  ASCVD risk is about 15.5%.  Still does not want to start statin at this time.

## 2023-01-19 ENCOUNTER — Encounter: Payer: Self-pay | Admitting: Family Medicine

## 2023-02-11 ENCOUNTER — Encounter: Payer: Self-pay | Admitting: Family Medicine

## 2023-02-12 ENCOUNTER — Telehealth: Payer: Self-pay

## 2023-02-12 NOTE — Telephone Encounter (Signed)
My chart message has been sent to provider from patient regarding same inquiry.

## 2023-02-12 NOTE — Telephone Encounter (Signed)
Copied from CRM #[id number]. Topic: Clinical - Medication Question >> Feb 10, 2023  1:39 PM Alvino Blood C wrote: Reason for CRM: PT is requesting a call back at [phone] to discuss the side effects he's having while taking his tirzepatide [facility]) & anti depressant medication

## 2023-04-09 ENCOUNTER — Other Ambulatory Visit: Payer: Self-pay | Admitting: Family Medicine

## 2023-04-09 DIAGNOSIS — I10 Essential (primary) hypertension: Secondary | ICD-10-CM

## 2023-07-02 ENCOUNTER — Ambulatory Visit: Payer: BC Managed Care – PPO | Admitting: Family Medicine

## 2023-07-08 ENCOUNTER — Other Ambulatory Visit: Payer: Self-pay | Admitting: Family Medicine

## 2023-07-08 DIAGNOSIS — K2100 Gastro-esophageal reflux disease with esophagitis, without bleeding: Secondary | ICD-10-CM

## 2023-08-07 ENCOUNTER — Other Ambulatory Visit: Payer: Self-pay | Admitting: Family Medicine

## 2023-08-07 DIAGNOSIS — I10 Essential (primary) hypertension: Secondary | ICD-10-CM

## 2023-08-07 NOTE — Telephone Encounter (Signed)
 Pls contact pt to schedule DM & HTN appt with Dr. Augustus Ledger. Sending 30 day med refill. Thx.

## 2023-11-03 ENCOUNTER — Encounter: Payer: Self-pay | Admitting: Sports Medicine
